# Patient Record
Sex: Female | Born: 1975 | Race: White | Hispanic: No | Marital: Married | Smoking: Former smoker
Health system: Southern US, Community
[De-identification: ages and names within clinical notes are randomized; demographics above are authoritative.]

## PROBLEM LIST (undated history)

## (undated) DIAGNOSIS — I10 Essential (primary) hypertension: Secondary | ICD-10-CM

## (undated) DIAGNOSIS — I509 Heart failure, unspecified: Secondary | ICD-10-CM

## (undated) DIAGNOSIS — T68XXXA Hypothermia, initial encounter: Secondary | ICD-10-CM

## (undated) DIAGNOSIS — I236 Thrombosis of atrium, auricular appendage, and ventricle as current complications following acute myocardial infarction: Secondary | ICD-10-CM

## (undated) DIAGNOSIS — N83209 Unspecified ovarian cyst, unspecified side: Secondary | ICD-10-CM

## (undated) HISTORY — DX: Heart failure, unspecified: I50.9

## (undated) HISTORY — PX: TONSILLECTOMY: SUR1361

## (undated) HISTORY — DX: Thrombosis of atrium, auricular appendage, and ventricle as current complications following acute myocardial infarction: I23.6

## (undated) HISTORY — DX: Hypothermia, initial encounter: T68.XXXA

## (undated) HISTORY — DX: Essential (primary) hypertension: I10

---

## 1998-05-28 ENCOUNTER — Other Ambulatory Visit: Admission: RE | Admit: 1998-05-28 | Discharge: 1998-05-28 | Payer: Self-pay | Admitting: Obstetrics & Gynecology

## 1999-05-06 ENCOUNTER — Other Ambulatory Visit: Admission: RE | Admit: 1999-05-06 | Discharge: 1999-05-06 | Payer: Self-pay | Admitting: Obstetrics & Gynecology

## 2000-05-16 ENCOUNTER — Other Ambulatory Visit: Admission: RE | Admit: 2000-05-16 | Discharge: 2000-05-16 | Payer: Self-pay | Admitting: Obstetrics & Gynecology

## 2001-07-10 ENCOUNTER — Other Ambulatory Visit: Admission: RE | Admit: 2001-07-10 | Discharge: 2001-07-10 | Payer: Self-pay | Admitting: Obstetrics & Gynecology

## 2002-08-09 ENCOUNTER — Other Ambulatory Visit: Admission: RE | Admit: 2002-08-09 | Discharge: 2002-08-09 | Payer: Self-pay | Admitting: Obstetrics & Gynecology

## 2003-08-13 ENCOUNTER — Other Ambulatory Visit: Admission: RE | Admit: 2003-08-13 | Discharge: 2003-08-13 | Payer: Self-pay | Admitting: Obstetrics & Gynecology

## 2003-08-14 ENCOUNTER — Other Ambulatory Visit: Admission: RE | Admit: 2003-08-14 | Discharge: 2003-08-14 | Payer: Self-pay | Admitting: Obstetrics & Gynecology

## 2003-12-09 ENCOUNTER — Other Ambulatory Visit: Admission: RE | Admit: 2003-12-09 | Discharge: 2003-12-09 | Payer: Self-pay | Admitting: Obstetrics & Gynecology

## 2004-08-27 ENCOUNTER — Other Ambulatory Visit: Admission: RE | Admit: 2004-08-27 | Discharge: 2004-08-27 | Payer: Self-pay | Admitting: Obstetrics & Gynecology

## 2005-09-08 ENCOUNTER — Other Ambulatory Visit: Admission: RE | Admit: 2005-09-08 | Discharge: 2005-09-08 | Payer: Self-pay | Admitting: Obstetrics & Gynecology

## 2007-07-09 ENCOUNTER — Inpatient Hospital Stay (HOSPITAL_COMMUNITY): Admission: AD | Admit: 2007-07-09 | Discharge: 2007-07-11 | Payer: Self-pay | Admitting: Obstetrics and Gynecology

## 2007-07-12 ENCOUNTER — Inpatient Hospital Stay (HOSPITAL_COMMUNITY): Admission: AD | Admit: 2007-07-12 | Discharge: 2007-07-12 | Payer: Self-pay | Admitting: Obstetrics and Gynecology

## 2007-07-13 ENCOUNTER — Encounter (INDEPENDENT_AMBULATORY_CARE_PROVIDER_SITE_OTHER): Payer: Self-pay | Admitting: Obstetrics & Gynecology

## 2007-07-13 ENCOUNTER — Inpatient Hospital Stay (HOSPITAL_COMMUNITY): Admission: AD | Admit: 2007-07-13 | Discharge: 2007-07-16 | Payer: Self-pay | Admitting: Obstetrics & Gynecology

## 2007-07-19 ENCOUNTER — Inpatient Hospital Stay (HOSPITAL_COMMUNITY): Admission: AD | Admit: 2007-07-19 | Discharge: 2007-07-19 | Payer: Self-pay | Admitting: Obstetrics and Gynecology

## 2007-10-12 ENCOUNTER — Ambulatory Visit: Payer: Self-pay

## 2007-10-12 ENCOUNTER — Ambulatory Visit: Payer: Self-pay | Admitting: Cardiology

## 2007-10-12 ENCOUNTER — Inpatient Hospital Stay (HOSPITAL_COMMUNITY): Admission: AD | Admit: 2007-10-12 | Discharge: 2007-10-17 | Payer: Self-pay | Admitting: Cardiology

## 2007-10-12 ENCOUNTER — Encounter: Payer: Self-pay | Admitting: Cardiology

## 2007-10-19 ENCOUNTER — Ambulatory Visit: Payer: Self-pay | Admitting: Cardiology

## 2007-10-19 ENCOUNTER — Ambulatory Visit: Payer: Self-pay

## 2007-10-19 LAB — CONVERTED CEMR LAB: Prothrombin Time: 13.6 s — ABNORMAL HIGH (ref 10.9–13.3)

## 2007-10-23 ENCOUNTER — Ambulatory Visit: Payer: Self-pay | Admitting: Cardiology

## 2007-10-23 LAB — CONVERTED CEMR LAB
INR: 1.3 — ABNORMAL HIGH (ref 0.8–1.0)
Prothrombin Time: 13.9 s — ABNORMAL HIGH (ref 10.9–13.3)

## 2007-10-25 ENCOUNTER — Ambulatory Visit: Payer: Self-pay | Admitting: Cardiology

## 2007-10-25 LAB — CONVERTED CEMR LAB
BUN: 5 mg/dL — ABNORMAL LOW (ref 6–23)
Chloride: 100 meq/L (ref 96–112)
Creatinine, Ser: 0.7 mg/dL (ref 0.4–1.2)
GFR calc non Af Amer: 102 mL/min
Glucose, Bld: 103 mg/dL — ABNORMAL HIGH (ref 70–99)
Sodium: 138 meq/L (ref 135–145)

## 2007-10-29 ENCOUNTER — Ambulatory Visit: Payer: Self-pay | Admitting: Cardiology

## 2007-11-07 ENCOUNTER — Ambulatory Visit: Payer: Self-pay | Admitting: Cardiovascular Disease

## 2007-11-12 ENCOUNTER — Ambulatory Visit: Payer: Self-pay | Admitting: Cardiology

## 2007-11-21 ENCOUNTER — Ambulatory Visit: Payer: Self-pay | Admitting: Internal Medicine

## 2007-11-21 ENCOUNTER — Ambulatory Visit (HOSPITAL_COMMUNITY): Admission: RE | Admit: 2007-11-21 | Discharge: 2007-11-21 | Payer: Self-pay | Admitting: Cardiology

## 2007-11-22 ENCOUNTER — Ambulatory Visit: Payer: Self-pay | Admitting: Internal Medicine

## 2007-12-10 ENCOUNTER — Ambulatory Visit: Payer: Self-pay | Admitting: Internal Medicine

## 2007-12-23 ENCOUNTER — Emergency Department (HOSPITAL_COMMUNITY): Admission: EM | Admit: 2007-12-23 | Discharge: 2007-12-23 | Payer: Self-pay | Admitting: Emergency Medicine

## 2007-12-25 ENCOUNTER — Encounter: Payer: Self-pay | Admitting: Cardiology

## 2007-12-25 ENCOUNTER — Ambulatory Visit: Payer: Self-pay | Admitting: Cardiology

## 2007-12-25 ENCOUNTER — Ambulatory Visit: Payer: Self-pay

## 2008-01-16 ENCOUNTER — Ambulatory Visit: Payer: Self-pay | Admitting: Cardiology

## 2008-01-21 ENCOUNTER — Ambulatory Visit: Payer: Self-pay | Admitting: Cardiology

## 2008-01-21 LAB — CONVERTED CEMR LAB
BUN: 10 mg/dL (ref 6–23)
CO2: 28 meq/L (ref 19–32)
GFR calc Af Amer: 106 mL/min
Potassium: 4.1 meq/L (ref 3.5–5.1)
Pro B Natriuretic peptide (BNP): 13 pg/mL (ref 0.0–100.0)
Sodium: 138 meq/L (ref 135–145)

## 2008-02-19 ENCOUNTER — Ambulatory Visit: Payer: Self-pay | Admitting: Cardiology

## 2008-02-28 ENCOUNTER — Ambulatory Visit: Payer: Self-pay | Admitting: Internal Medicine

## 2008-03-17 ENCOUNTER — Ambulatory Visit: Payer: Self-pay | Admitting: Cardiology

## 2008-04-09 ENCOUNTER — Ambulatory Visit: Payer: Self-pay | Admitting: Cardiology

## 2008-04-29 ENCOUNTER — Ambulatory Visit: Payer: Self-pay | Admitting: Internal Medicine

## 2008-04-29 ENCOUNTER — Ambulatory Visit: Payer: Self-pay | Admitting: Cardiovascular Disease

## 2008-04-29 ENCOUNTER — Encounter: Payer: Self-pay | Admitting: Internal Medicine

## 2008-04-29 ENCOUNTER — Ambulatory Visit: Payer: Self-pay

## 2008-12-12 ENCOUNTER — Encounter: Payer: Self-pay | Admitting: Internal Medicine

## 2008-12-12 ENCOUNTER — Ambulatory Visit: Payer: Self-pay

## 2008-12-12 ENCOUNTER — Ambulatory Visit: Payer: Self-pay | Admitting: Internal Medicine

## 2010-01-14 ENCOUNTER — Ambulatory Visit: Payer: Self-pay | Admitting: Internal Medicine

## 2010-01-14 DIAGNOSIS — I428 Other cardiomyopathies: Secondary | ICD-10-CM

## 2010-11-29 ENCOUNTER — Encounter: Payer: Self-pay | Admitting: Obstetrics & Gynecology

## 2010-12-07 NOTE — Assessment & Plan Note (Signed)
Summary: per check out/sf  Medications Added LISINOPRIL 10 MG TABS (LISINOPRIL) Take one-half tablet by mouth daily CARVEDILOL 6.25 MG TABS (CARVEDILOL) Take one tablet by mouth twice a day POTASSIUM CHLORIDE CRYS CR 20 MEQ CR-TABS (POTASSIUM CHLORIDE CRYS CR) Take one tablet by mouth daily      Allergies Added: NKDA  Visit Type:  Follow-up Primary Provider:  Devonne Doughty, MD  CC:  Follow-up.  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  Fortunately, her most recent echocardiogram in June 2009 and 2010 has shown a complete recovery of her LV function with an EF of 55-60%.   She returns for routine f/u. Doing well from caradiac standpoint. No CP or SOB. Occasionally tired. If stop lasix does get a little bit of swelling. Compliant with all meds. Not planning any further preganancies.    Current Medications (verified): 1)  Furosemide 20 Mg Tabs (Furosemide) .... Take One Tablet By Mouth Daily. 2)  Lisinopril 10 Mg Tabs (Lisinopril) .... Take One-Half Tablet By Mouth Daily 3)  Carvedilol 6.25 Mg Tabs (Carvedilol) .... Take One Tablet By Mouth Twice A Day 4)  Potassium Chloride Crys Cr 20 Meq Cr-Tabs (Potassium Chloride Crys Cr) .... Take One Tablet By Mouth Daily  Allergies (verified): No Known Drug Allergies  Past History:  Past Medical History: Last updated: 12/06/2008 1) CHF     --h/o peripartum CM EF 20%, onset 12/08     --LV function recovered EF 60% (6/09) 2) h/o LV apical thrombus, resolved 3) HTN  Review of Systems       As per HPI and past medical history; otherwise all systems negative.   Vital Signs:  Patient profile:   35 year old female Height:      68 inches Weight:      208 pounds BMI:     31.74 Pulse rate:   84 / minute BP sitting:   144 / 82  (left  arm)  Vitals Entered By: Laurance Flatten CMA (January 14, 2010 11:07 AM)  Physical Exam  General:  Gen: well appearing. no resp difficulty HEENT: normal Neck: supple. no JVD. Carotids 2+ bilat; no bruits Cor: PMI nondisplaced. Regular rate & rhythm. No rubs, gallops, murmur. Lungs: clear Abdomen: soft, nontender, nondistended. Good bowel sounds. Extremities: no cyanosis, clubbing, rash, edema. boot on RLE Neuro: alert & orientedx3, cranial nerves grossly intact. moves all 4 extremities w/o difficulty. affect pleasant healthy appearing.     Impression & Recommendations:  Problem # 1:  OTHER PRIMARY CARDIOMYOPATHIES (ICD-425.4) Dpoing very well. Would continue current medications. Can ues lasix as needed instead of once daily if she'd like. Reminded her of importanct of not getting pregnant to avoid recurrent cardiomyopathy.  Other Orders: EKG w/ Interpretation (93000)

## 2011-03-22 NOTE — Assessment & Plan Note (Signed)
[facility]                               LIPID CLINIC NOTE   NAME:Losh, Danaiya                           MRN:          [id number]  DATE:10/19/2007                            DOB:          [birth year]    Ms. Bobb had a PT INR drawn in the lab today because she has been  maintained on Lovenox 65 mg subcutaneously twice daily and was started  on Coumadin 5 mg p.o. daily earlier in the week, around the December  5th.  Her INR was called to me at 5:30 and was 1.2, virtually unchanged.  I contacted the patient by phone at about 6:15 this evening on her cell  phone.  I spoke with Ms. Erlandson and her husband.  I asked Ms. Inscoe to  continue her Lovenox 65 mg subcutaneously twice a day and increase her  Coumadin to 10 mg today only and then 7.5 mg daily.  She will come in at  8:30 on Tuesday morning, the 16th, for additional blood work at that  time.  That will be drawn in the lab and I will follow up with her as  soon as the results are available.   I confirmed with the patient she is having no unusual bleeding,  confirmed with the patient that her medications have not changed.  Per  her request, I will call the [facility] in [place] with her Lovenox  refill.  I did this tonight at 10 p.m.  The patient had a dose for  tonight.  I left a voice mail that she needed a box of 10 with one  refill.   Luis Abed, MD, [facility] is her physician.   And, the direction are to inject 0.65 ml which equal 65 mg  subcutaneously twice daily as per previous instructions.  The patient  and her husband verbalized understanding of this plan and plan for  followup.  She has the answering service number to call if questions or  problems in the meantime as does the pharmacy.  I gave the pharmacy my  pager number in addition to the answering service number with questions  or problems in the meantime.      Shelby Dubin, PharmD, BCPS, CPP  Electronically Signed      Bevelyn Buckles. Bensimhon, MD  Electronically Signed   MP/MedQ  DD: 10/19/2007  DT: 10/21/2007  Job #: [id number]

## 2011-03-22 NOTE — Assessment & Plan Note (Signed)
[facility]                            CARDIOLOGY OFFICE NOTE   NAME:[name], [name]                           MRN:          [id number]  DATE:04/29/2008                            DOB:          Jan 28, 1976    INTERVAL HISTORY:  [name] is a very pleasant 35 year old woman with a  history of severe peripartum cardiomyopathy with an EF of previously  20%.  This was diagnosed back in December 2008.  Most recent  echocardiogram showed normalization of her LV function with an EF of  55%.  This was in February 2009.  She initially had an apical clot, but  she has been on Coumadin and there was no evidence of this on her most  recent echo.   She comes today for routine followup.  She is doing great.  No chest  pain.  No shortness of breath.  She does have some occasional lower  extremity edema that she manages with Lasix.  She has not had any  limitations.   CURRENT MEDICATIONS:  1. Coreg 6.25 b.i.d.  2. Lasix 20 a day.  3. Warfarin.  4. Potassium 20 a day.  5. Digoxin 0.125 a day.  6. Lisinopril 10 mg a day.   PHYSICAL EXAMINATION:  GENERAL:  She is well appearing in no acute  distress.  Ambulatory in the clinic without any respiratory difficulty.  VITAL SIGNS:  Blood pressure is 100/60, heart rate is 72, and weight is  172.  HEENT:  Normal.  NECK:  Supple.  No JVD.  Carotids are 2+ bilaterally without any bruits.  There is no lymphadenopathy or thyromegaly.  CARDIAC:  Regular rate and rhythm.  No murmurs, rubs, or gallops.  PMI  is nondisplaced.  LUNGS:  Clear.  ABDOMEN:  Soft, nontender, and nondistended.  No hepatosplenomegaly.  No  bruits.  No masses appreciated.  EXTREMITIES:  Warm with no cyanosis, clubbing, or edema.  No rash.  NEURO:  Alert and oriented x3.  Cranial nerves II-XII are intact.  Moves  all 4 without difficulty.  Affect is pleasant.   EKG shows sinus rhythm at a rate of 61.  No ST-T wave abnormalities.   ASSESSMENT/PLAN:  1.  History of congestive heart failure secondary to peripartum      cardiomyopathy.  Her ejection fraction has now normalized.  We will      get another echocardiogram to confirm that this is still the case.      I suspect it is so.  We will stop her digoxin today and likely stop      her Coumadin after her echo.  I would keep her on her ACE inhibitor      and beta-blocker, probably lifelong.  We once again had a      discussion about the fact that she should take measures so that she      does not become pregnant again due to the risk of recurrence and      possibly even leading to death or need for transplant.  She      understands this  and agrees.   DISPOSITION:  We will await the results of her echocardiogram.  If it  remains normal, once again, she can stop her Coumadin, and she will  follow up with [place] in 1 year.     [name] Buckles. Bensimhon, MD  Electronically Signed    DRB/MedQ  DD: 04/29/2008  DT: 04/30/2008  Job #: [id number]

## 2011-03-22 NOTE — Assessment & Plan Note (Signed)
[facility]                            CARDIOLOGY OFFICE NOTE   NAME:Lamere, Negar                           MRN:          [id number]  DATE:12/12/2008                            DOB:          Mar 13, 1976    INTERVAL HISTORY:  [name] is a very pleasant 35 year old woman with a  history of congestive heart failure secondary to severe peripartum  cardiomyopathy with ejection fraction of 20%, dating back to December  2008 after the birth of her twins.  Fortunately, her most recent  echocardiogram in June 2009 has shown a complete recovery of her LV  function with an EF of 55-60%.  Her initial course was complicated by an  apical clot, which she was treated with Coumadin.  However, this has  resolved and she has been off her Coumadin.   She comes in today for 39-month followup.  Overall, she is doing fairly  well.  She notes that she remains very fatigued, but says this is mostly  due to the fact that she is under extreme amount of stress, both at home  and with work.  Her husband lost his job due to his temper and she is  the only one supporting the 5 members of her family.  Her father also  underwent carotid endarterectomy and bypass surgery and scheduled to  have his other carotid operated on today.   She denies any orthopnea or PND.  She says she is able to do all she  needs to do without any exertional dyspnea.  She does continue to have a  little bit of lower extremity edema throughout the day, which resolves  overnight.  She has not had any palpitations, syncope, or presyncope.  She has been taking Xanax occasionally for her stress level.  She is now  using a Mirena IUD and she is well aware of the fact that she needs to  prevent any further pregnancies.   CURRENT MEDICATIONS:  1. Coreg 6.25 b.i.d.  2. Lasix 20 a day.  3. Potassium 20 a day.  4. Lisinopril 10 mg a day.  5. A bunch of vitamins and minerals.   PHYSICAL EXAMINATION:  GENERAL:  She is a  well-appearing in no acute  distress.  She ambulates around the clinic without any respiratory  difficulty.  VITAL SIGNS:  Blood pressure is 120/80 and heart rate is 79.  Her weight  is 198 pounds, which is up about 50 pounds from April of last year.  HEENT:  Normal.  NECK:  Supple.  There is no JVD.  Carotids are 2+ bilaterally without  any bruits.  There is no lymphadenopathy or thyromegaly.  CARDIAC:  Her  PMI is normal.  She has regular rate and rhythm with no murmurs, rubs,  or gallops.  LUNGS:  Clear.  ABDOMEN:  Obese and nontender.  There is no hepatosplenomegaly  appreciated.  No bruits or masses.  There are hypoactive bowel sounds.  EXTREMITIES:  Warm with no cyanosis or clubbing.  There is trace edema.  No rash.  NEUROLOGIC:  Alert and oriented x3.  Cranial nerves II through XII are  grossly intact.  She moves all 4 extremities without difficulty.  Affect  is pleasant, but somewhat stressed.   EKG shows sinus arrhythmia at a rate of 79.  No ST-T wave abnormalities.   ASSESSMENT/PLAN:  History of congestive heart failure, secondary to  peripartum cardiomyopathy.  This is resolved.  Given her fatigue and  edema, I do think it is reasonable to repeat one more echocardiogram to  make sure that her left ventricular function remains normal.  Though  based on her exam, I would be very surprised if this were not the case.  We once again revisited the issue of her using a significant birth  control methods, so that she does not get pregnant again for fear of  recurrent peripartum myopathy.   DISPOSITION:  We will see her back in 1 year for followup.  Pending the  results of her echocardiogram.     Bevelyn Buckles. Bensimhon, MD  Electronically Signed    DRB/MedQ  DD: 12/12/2008  DT: 12/12/2008  Job #: [id number]

## 2011-03-22 NOTE — Assessment & Plan Note (Signed)
[facility]                          CHRONIC HEART FAILURE NOTE   NAME:[name], [name]                           MRN:          [id number]  DATE:10/25/2007                            DOB:          [birth year]    PRIMARY CARDIOLOGIST:  Dr. Jerral Bonito.   No primary care physician at this time.   [name] is new to the heart failure clinic.  She is a very pleasant  35 year old Caucasian female, status post recent delivery of healthy  twins.  Post partum developed symptoms of increased shortness of breath,  fatigue, and volume overload.  Was admitted to [facility] on  October 12, 2007 for acute systolic congestive heart failure secondary  to post partum cardiomyopathy.  She was found to have an LV thrombus  requiring anticoagulation by echocardiogram at that time.  She was also  diagnosed with hypertension, mitral regurgitation, sinus tachycardia,  pericardial effusion.  [name] was followed closely throughout  hospitalization, treated with IV heparin.  Coumadin was initiated per  pharmacy.  She was diuresed.  Beta blocker and digoxin initiated for  heart failure and sinus tachycardia.  TSH was noted to be within normal  limits.  On December the 10th, the patient was felt to be stable enough  to be discharged home.  The patient returns today for followup and  further management of her heart failure and medication titration.  Ms.  [name] is a very pleasant lady.  She is accompanied by her husband,  [name].  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  She has a  44 year old child at home.  She has a very supportive family.  Members  are assisting with care of her twins.  She is rather emotional today  considering all that she has been through, and very attentive to any  information she can gather in regards to her diagnosis.  She is  apprehensive about her future, and whether or not she can return to work  and lead a normal life in caring  for her children.  She does have a  family history of coronary artery disease with her father having an MI  in his late 75s, and having stent placement.  She states during her  first pregnancy 10 years ago, she was toxemic, and was placed on  hydrochlorothiazide post partum for control of fluid and hypertension,  but states that was only for short term, and has not had any problems  until this occurred.   PAST MEDICAL HISTORY:  1. New-onset congestive heart failure, most likely secondary to      nonischemic cardiomyopathy in the setting of peripartum      cardiomyopathy with an ejection fraction of 20% by echocardiogram.  2. Status post echocardiogram during recent hospitalization showing RV      dysfunction, moderate pericardial effusion, probable pleural      effusion and large, mobile clot in LV.  3. Anticoagulation therapy.  4. Hypertension.  5. Sinus tachycardia.   CURRENT MEDICATIONS:  1. Coreg 3.125 mg b.i.d.  2. Furosemide 40 mg daily.  3. Lisinopril 20 mg daily.  4. Digoxin 0.25 mg daily.  5. KCl 20 mEq daily.  6. Coumadin per Coumadin clinic.   Medications p.r.n., the patient states she is taking none at this time.   PHYSICAL EXAMINATION:  Weight 143 pounds, blood pressure 90/58 with a  heart rate of 84, resting pulse ox 98% on room air.  [name] is in no acute distress.  She has no signs of jugular vein distention at 45 degree angle today.  LUNGS:  Clear to auscultation.  CARDIOVASCULAR EXAM:  Reveals an S1 and S2.  I do not hear an S3 gallop  at this time.  ABDOMEN:  Soft, nontender, positive bowel sounds.  LOWER EXTREMITIES:  Without cyanosis, clubbing, or edema.  NEUROLOGICALLY:  Alert and oriented x3.   CLINICAL DATA:  Ambulated patient greater than 300 feet here in the  clinic.  She became mildly dyspneic at the end of the walk.  Resting  pulse ox 98%, drifted down to 94% during the walk without further  problems, returned to baseline quickly.  No  lightheadedness or  dizziness.   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  The patient without signs of volume overload at this  time.  I have spent approximately 1 hour with [name] and [name], reviewing  the etiology of her heart failure, medications, management and a brief  expectation of the next few months.  Adjustments will be made in  medications today.  [name] has not tolerated further titration of  medications during recent hospitalization.  We are going to stagger her  meds.  This is her medication regimen as of now.  In the morning she  will take Coreg 3.125 mg at that time, Lasix 10 mg, and Digoxin 0.25.  At noon she will take lisinopril 10 mg.  At 3 p.m. she will take Lasix  10 mg, and her Coumadin, and then at bedtime she will take Coreg 2 of  her 3.125 mg tablets to equal 6.25 mg.  I will see her back in 2 weeks.  Will also check baseline lab work at this time.      Dorian Pod, ACNP  Electronically Signed      Rollene Rotunda, MD, [facility]  Electronically Signed   MB/MedQ  DD: 10/25/2007  DT: 10/26/2007  Job #: [id number]

## 2011-03-22 NOTE — Assessment & Plan Note (Signed)
[facility]                          CHRONIC HEART FAILURE NOTE   NAME:[name], [name]                           MRN:          [id number]  DATE:12/10/2007                            DOB:          [birth year]    PRIMARY CARDIOLOGIST:  Dr. Gala Romney.  No primary care physician at this  time.   Niaja returns today for follow-up of her congestive heart failure which is  most likely secondary to nonischemic cardiomyopathy in the setting of  peripartum cardiomyopathy with EF 20% by echocardiogram.  I did have [name]  complete CPX test recently which she did quite well on.  Her peak pO2  was 27.5 peak RER 1.21.  Exercise testing with gas exchange demonstrate  low normal functional capacity and compared to match sedentary norms.  There appeared to be of mild ventilatory limit at peak exercise but this  may indicate the patient motivation to do well given her overall normal  spirometry.  [name] states she is feeling back to her normal self.  She  does complain of mild fatigue and some chest discomfort.  When  questioned about the chest discomfort it is an atypical chest discomfort  brought on by movement change in position.  She does complain of some  mild fatigue and occasional lightheadedness and dizziness if she moves  too abruptly.  Otherwise she continues to care for her twins and her 37-  year-old son.  She is anxious to return to work.  Last echocardiogram in  December showed EF of 20%.   PAST MEDICAL HISTORY:  1. New-onset congestive heart failure in the setting of peripartum      cardiomyopathy with EF 20% by echocardiogram.  2. Echocardiogram showing RV dysfunction, moderate pericardial      effusion, probable pleural effusion and large mobile clot in LV  3. Anticoagulation therapy secondary to above.  4. History of hypertension.  5. Sinus tachycardia.   REVIEW OF SYSTEMS:  As stated above, otherwise negative.   PHYSICAL EXAM:  Weight 151 up 4 pounds, blood  pressure 104/69, heart  rate 58.  CLINICAL DATA:  12-lead EKG today showed sinus brady at a rate 51 no  acute findings.  In no acute distress.  No signs of jugular vein distention of 45 degrees angle.  LUNGS:  Clear to auscultation bilaterally.  Cardiovascular exam reveals S1-S2 regular rhythm.  ABDOMEN:  Soft, nontender, positive bowel sounds.  LOWER EXTREMITIES:  Without clubbing, cyanosis or edema.  Neurologically alert and oriented x3.   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  Status post recent CPX test with good results.  We will  plan on repeating echocardiogram. I have already cleared [name] to return  to work as tolerated.  Will continue her Coreg at current dose and the  cut  her digoxin back to 0.125 mg daily secondary to sinus brady.  Will touch  base with [name] by phone after echocardiogram results obtained.      Dorian Pod, ACNP  Electronically Signed      Rollene Rotunda, MD, [facility]  Electronically Signed  MB/MedQ  DD: 12/10/2007  DT: 12/10/2007  Job #: [id number]

## 2011-03-22 NOTE — Assessment & Plan Note (Signed)
[facility]                          CHRONIC HEART FAILURE NOTE   NAME:[name], [name]                           MRN:          [id number]  DATE:01/16/2008                            DOB:          May 21, 1976    PRIMARY CARDIOLOGIST:  Dr. Gala Romney.   PRIMARY CARE PHYSICIAN:  None at this time.   [name] returns today for followup of congestive heart failure which is  secondary to peripartum nonischemic cardiomyopathy with EF of 20% by  echocardiogram.  Most recent echocardiogram repeated December 25, 2007  showed EF back to 55%.  No mural apical clot noted at that time.  [name]  also underwent a CPX test recently that showed normal functional  capacity compared to matched sedentary norms and has done amazingly well  since her diagnosis back in December.  She continues to remain  anticoagulated, followed here in the Coumadin Clinic by Dr. Shelby Dubin.  She has returned to work full time and is caring for her 61-month-old  twins with the assistance of family and her husband.  She had an episode  of chest discomfort and was seen in the [facility] emergency room where  she was found to have negative cardiac enzymes.  Chest pain was felt to  be noncardiac in origin.  [name] states she returned home and had no  further problems.  Since returning to work, however, she has complained  of some abdominal bloating and some ankle edema by the end of the day.  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  She has been taking Lasix  10 mg b.i.d. but states this does not seem to be helping with the  bloating.  She denies any orthopnea or PND.  Has mild occasional  dizziness if she stands too quickly or turns too quickly.   PAST MEDICAL HISTORY:  1. Congestive heart failure in the setting of presumed nonischemic      cardiomyopathy with EF previously 20% now 55%.  2. Previous echocardiogram showing RV  dysfunction, moderate      pericardial effusion, probable pleural effusion and large mobile      clot.  Repeat echocardiogram just the last month showed no mural      apical clot, mild mitral valve regurgitation with EF 55%, and left      atrium was mildly dilated.  3. Anticoagulation therapy secondary to above.  4. History of hypertension.  5. Sinus tachycardia.   REVIEW OF SYSTEMS:  As stated above, otherwise negative.   CURRENT MEDICATIONS:  1. Include Coreg 6.25 mg b.i.d.  2. Furosemide 10 mg b.i.d.  3. Warfarin as directed.  4. KCl 20 mEq [name].  5. Digoxin 0.125 mg [name].  6. Lasix 10 mg p.r.n.   CLINICAL DATA:  Most recent lab work done February 15, hemoglobin 11.5,  hematocrit 33.4.  PT INR 25.9 and 2.3.  Potassium 4.4, BUN and  creatinine 9 and 0.7, AST 27, ALT 12.  Point of care, negative.  BNP  less than  30, dig 0.6.   PHYSICAL EXAM:  Weight 158, blood pressure 117/65 with a heart rate of  81.  [name] is in no acute distress.  Well-nourished 35 year old at Caucasian  female.  JVD maybe 6 to 7 cm at 45 degree angle.  LUNGS:  Clear to auscultation.  Cardiovascular exam reveals S1-S2 regular rate and rhythm.  ABDOMEN:  Soft, nontender, positive bowel sounds.  LOWER EXTREMITIES:  Without clubbing, cyanosis.  She has trace ankle  edema.  NEUROLOGICAL:  Alert and oriented x3.   IMPRESSION:  Congestive heart failure secondary to presumed nonischemic  peripartum cardiomyopathy.  Echocardiogram with significant improvement.  Will continue medications at current dose and the patient needs to  follow up with Dr. Gala Romney for routine cardiology visit.  I suspect  that the patient may need a stress test just to confirm nonischemic  etiology after recent episode of chest discomfort.  Will leave this up  to Dr. Prescott Gum discretion.  I have cautioned [name] about her fluid  intake, to use sips of water during the day, hard candy, or ice, as she  is on the phone a lot talking and  she states her mouth gets dry.  Will  check blood work today also.      Dorian Pod, ACNP  Electronically Signed      Rollene Rotunda, MD, [facility]  Electronically Signed   MB/MedQ  DD: 01/16/2008  DT: 01/17/2008  Job #: [id number]

## 2011-03-22 NOTE — Assessment & Plan Note (Signed)
[facility]                            CARDIOLOGY OFFICE NOTE   NAME:[name], [name]                           MRN:          [id number]  DATE:10/12/2007                            DOB:          18-Dec-1975    HISTORY OF PRESENT ILLNESS:  [name] is 35 years old.  She recently  delivered healthy twins.  Prior to her delivery she was doing well.  She  has had some borderline hypertension and fluid overload.  Since then,  however, she has had significant shortness of breath.  This has been  quite limiting to her.  She has not had any true PND or orthopnea.  However, she has had shortness of breath both during the day and at  night and restless activity level.  She had not had syncope or  presyncope.  There is a family history of coronary disease with her  father having MI in approximately his late 21s.  She is not having any  significant chest pain.   PAST MEDICAL HISTORY:   ALLERGIES:  No known drug allergies.   MEDICATIONS:  She is not taking any medications on a regular basis at  this time.   SOCIAL HISTORY:  The patient is married.  She has her twins and a 4-  year-old son.  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  Her husband also does the same.  She does not  smoke.   FAMILY HISTORY:  There is a family history of coronary disease as  outlined above.   REVIEW OF SYSTEMS:  The patient has history of some headaches.  There is  a question of some hypertension in the past  Otherwise, her review of  systems is negative.   PHYSICAL EXAMINATION:  VITAL SIGNS:  Weight today is 160 pounds.  Blood  pressure 155/109, pulse 111.  GENERAL:  The patient is oriented to person, place and time.  Affect is  normal.  HEENT:  No xanthelasma.  She has normal extraocular motion.  There are  no carotid bruits.  There is no jugular venous distention.  LUNGS:  Clear.  Respiratory effort is not labored.  CARDIAC:  S1, S2.   There appears to be a summation gallop at this time.  Her heart rate is elevated.  ABDOMEN:  Soft.  There are no masses or bruits.  The patient does have  1+ peripheral edema.  There are no musculoskeletal deformities.   STUDIES:  EKG reveals sinus tachycardia with nonspecific ST-T wave  changes.  The patient tells me she has had her thyroid checked and this  was normal.   PROBLEMS:  1. Hypertension.  She is hypertensive at this time and we will start      medications for this.  2. History of headaches.  3. Question of a systolic murmur.  With her rapid rate today, I do not      hear a definite murmur, but we will reassess her.  The echo will      also tell us more.  4. EKG  with nonspecific ST-T wave changes.  5. Peripheral edema.  I do believe that she is fluid overloaded and      this needs to be treated .  6. Resting sinus tachycardia.  This also needs to be treated.  7. Question of a summation gallop on her physical exam.  8. Shortness of breath since her delivery.   I am concerned about the patient's symptomatology.  She needs a 2D echo  soon to rule out a pericardial cardiomyopathy.  She needs to be on low-  dose Coreg.  She also needs to be on a diuretic.  She is breast feeding.  She wants to do of course what is best for her overall health at this  point.  We will list her medications, and she will be in touch with Dr.  Edward Jolly concerning whether she can continue to breast feed.  We will  obtain a 2D echo and see her in very early follow up next week to  reassess her status.     Luis Abed, MD, [facility]  Electronically Signed    JDK/MedQ  DD: 10/12/2007  DT: 10/12/2007  Job #: [id number]   cc:   Randye Lobo, M.D.

## 2011-03-22 NOTE — Assessment & Plan Note (Signed)
[facility]                          CHRONIC HEART FAILURE NOTE   NAME:[name], [name]                           MRN:          [id number]  DATE:11/12/2007                            DOB:          22-Jul-1976    PRIMARY CARDIOLOGIST:  Dr. Jerral Bonito.   No primary care physician at this time.   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  I initially saw [name] as a new patient on  December 18.  She returns today for reevaluation and further titration  of her medications.  When I saw [name] in December I wrote out exactly how  I wanted her to take her medications, as I was titrating her carvedilol.  She states tolerance of medication adjustments.  She has complained of  mild dizziness if she moves too quickly, otherwise has done quite well  on current doses.  She continues to complain of ongoing fatigue but  denies any shortness of breath or dyspnea on exertion.  She is  complaining of some frequent headaches but has been under increased  stress as she has not been working and has relatively new twins at home  to care for.  She does have the assistance and support of her husband  and family, but states she is anxious to find out whether or not she is  going to be able to return to work.  I currently have [name] taking 3.125  mg of carvedilol in the a.m. and 6.25 in the evening.  She does complain  of some mild weight gain over the holidays (Christmas and New Year's)  with some mild abdominal bloating but no peripheral edema.  Denies any  presyncope or syncopal episodes.   PAST MEDICAL HISTORY:  1. 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  2. Echocardiogram showing RV dysfunction, moderate pericardial  effusion, probable pleural effusion and large mobile clot in LV.  3. Anticoagulation therapy secondary to the above.  4. History of hypertension.  5. Sinus tachycardia.   REVIEW OF SYSTEMS:  As stated above.   CURRENT MEDICATIONS:  1. Coreg 3.125 mg in the morning and 6.25 mg in the evening.  2. Lasix 10 mg in the a.m. and p.m.  3. Digoxin 0.25 mg in the a.m.  4. Lisinopril 10 mg at noon.  5. Warfarin per [facility] at 4 p.m.   PHYSICAL EXAMINATION:  Weight 146, blood pressure 126/68, manual 118/76,  with a heart rate of 70.  [name] is in no acute distress.  No signs of  jugular vein distention at 45-degrees angle.  LUNGS:  Clear to auscultation.  CARDIOVASCULAR EXAM:  Reveals S1 and S2, regular rate and rhythm.  I do  not hear an S3 gallop at this time.  ABDOMEN:  Soft, nontender, mildly distended.  LOWER EXTREMITIES:  Without clubbing, cyanosis or edema.  NEUROLOGICALLY:  Alert and oriented x3.   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  At this time, the patient continues to have class II  symptoms.  I am going to increase her Coreg to 6.25 mg b.i.d., schedule  her for a CPX test for further evaluation, verification of her  limitations, and follow up in 4 weeks for reevaluation.  [name] knows to  call me on my cell phone if she has any problems.  I am also going to  have her take an extra 20 mg of Lasix today.      [name], ACNP  Electronically Signed      Rollene Rotunda, MD, [facility]  Electronically Signed   MB/MedQ  DD: 11/12/2007  DT: 11/12/2007  Job #: [id number]

## 2011-03-22 NOTE — H&P (Signed)
NAMEEVERLENE, CUNNING                  ACCOUNT NO.:  [id number]   MEDICAL RECORD NO.:  [id number]           PATIENT TYPE:   LOCATION:                                 FACILITY:   PHYSICIAN:  Luis Abed, MD, FACCDATE OF BIRTH:  11/03/1976   DATE OF ADMISSION:  DATE OF DISCHARGE:                              HISTORY & PHYSICAL   [name] is a very pleasant 35 year old.  I saw her in the office  today and decision has been made to admit her to the hospital.  She  recently delivered healthy twins.  She had borderline hypertension and  fluid overload.  Since then, she has had marked shortness of breath.  This has been quite limiting for her.  She has had some mild PND.  She  has had some peripheral edema.  She has not been able to move about well  other than feeling quite short of breath.  She does have a family  history of coronary disease through her father having a heart attack in  her late 22s.  She has not had any significant chest pain.   ALLERGIES:  No known drug allergies.   MEDICATIONS:  She is not on any medications on a regular basis at this  time.   SOCIAL HISTORY:  The patient is married.  She has her twins and a 13-  year-old son.  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  Her husband does the same.  She does not smoke.   FAMILY HISTORY:  There is a family history of coronary disease.   REVIEW OF SYSTEMS:  She has had some mild headaches.  Otherwise, her  review of systems is negative.   PHYSICAL EXAMINATION:  VITAL SIGNS:  Her weight when I saw her today was  160 pounds.  Blood pressure was 155/109 with a pulse of 111.  GENERAL APPEARANCE:  Patient was oriented to person, time and place.  Her affect was normal.  Her husband was in the room at the time of  evaluation.  HEENT:  There was no xanthelasma.  She has normal extraocular motion.  NECK:  There were no carotid bruits.  She had no jugular venous  distension.  LUNGS:   Evaluation did not reveal any rales to my exam.  Her respiratory  effort was not labored.  CARDIOVASCULAR:  Exam revealed a sinus tachycardia.  There was an S1  with an S2 and a summation gallop.  ABDOMEN:  Soft, no masses or bruits.  EXTREMITIES:  She did have 1+ peripheral edema.  There were no major  musculoskeletal deformities.   EKG revealed sinus tachycardia with nonspecific ST-T wave changes.  She  tells me that her thyroid was checked recently and that this was normal.  After the visit was completed, the patient had a 2-dimensional echo  which I reviewed and immediately followed by contacting the patient and  arranging for admission.  Her echo shows that the patient's ventricle is  of abnormal size.  Her ejection fraction is 20%.  There is some  mitral  regurgitation.  She has a moderate pericardial effusion that is  circumferential.  There also appears to be a pleural effusion.  There is  right ventricular dysfunction in addition.  Also, there is a large  mobile clot in the apex of the left ventricle.   PROBLEMS:  1. Hypertension.  She is hypertensive at this time.  This will be      treated.  2. History of headaches.  3. Mitral regurgitation by echo.  4. Sinus tachycardia.  5. Peripheral edema.  6. Ejection fraction of 20%.  7. Right ventricular dysfunction.  8. Moderate pericardial effusion that is circumferential with no      evidence of tamponade.  9. Moderate pulmonary hypertension with a right ventricular systolic      pressure, estimate of 50 mmHg.  10.Probable pleural effusions.  She needs a chest x-ray.  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   The patient is admitted.  We will start Lasix.  Captopril will be  started at 6.25 p.o. t.i.d.  Heparin will be started immediately along  with Coumadin.  We can decide on October 13, 2007, if she is stable  enough for the initiation of a beta-blocker.   I called and explained the entire situation to the patient.   She was  tearful over the telephone but willing and understanding to come into  the hospital today.  Arrangements have been made.  Dr. Gala Romney has  also seen the patient's echo and I have reviewed the entire status with  him.  He will be seeing her over the weekend.   The patient has a significant cardiovascular problem at this point and  she will be treated aggressively.  Will also notify her gynecologist.  The patient has already notified her pediatricians who has instructed  her to stop breast-feeding altogether.  Therefore, we will not have to  worry about any medications in this regard.      Luis Abed, MD, [facility]  Electronically Signed     JDK/MEDQ  D:  10/12/2007  T:  10/12/2007  Job:  [id number]

## 2011-03-22 NOTE — Op Note (Signed)
[name], [name]                  ACCOUNT NO.:  [id number]   MEDICAL RECORD NO.:  [id number]          PATIENT TYPE:  INP   LOCATION:  9198                          FACILITY:  [facility]   PHYSICIAN:  Gerrit Friends. Aldona Bar, M.D.   DATE OF BIRTH:  1976-05-12   DATE OF PROCEDURE:  07/13/2007  DATE OF DISCHARGE:                               OPERATIVE REPORT   PREOPERATIVE DIAGNOSES:  1. A 33 to 34-week intrauterine pregnancy.  2. Twins.  3. Preterm labor.   POSTOPERATIVE DIAGNOSES:  1. A 33 to 34-week intrauterine pregnancy.  2. Twins.  3. Preterm labor.  4. Delivery of twin A - vacuum extraction assisted - Vertex - 2275      grams - Apgars 9 and 9; twin B - frank breech - assisted breech      extraction - 2106 grams - Apgars 8 and 9.  Both A & B Female.   PROCEDURE:  Primary low transverse cesarean section.   SURGEON:  Gerrit Friends. Aldona Bar, M.D.   ANESTHESIA:  Spinal, Dr. Arby Barrette.   HISTORY:  This 35 year old gravida 4, para 1 with known twins has been  followed very closely and for the past five days has had multiple visits  to [facility] with preterm contractions and borderline  hypertension.  She was actually admitted for a 48-hour duration and  subsequently discharged on Procardia, but continued to have contractions  and remained very uncomfortable although her blood pressures essentially  normalized with all lab data being normal.  She came to the office this  morning for follow-up, having been seen yesterday maternal/fetal  medicine with reactive nonstress test of both twins.  In the office, she  was contracting probably every 6-7 minutes, was extremely uncomfortable,  was noted to have gained 12 pounds in the past week and although she had  no proteinuria and her blood pressure was acceptable, on vaginal exam it  was found that her cervix had changed to at least 4 cm of dilatation and  she was sent to [facility] to be readied for delivery by primary  low transverse cesarean  section because of progressive preterm labor.   The patient was taken to the operating room and once in the operating  room, spinal anesthetic was placed by Dr. Arby Barrette without difficulty.  Thereafter, the patient was prepped and draped in the usual fashion for  cesarean section with a Foley catheter placed.  Both babies had good  fetal heart tones prior to the onset of the procedure.   Once the patient was adequately prepped and draped and good anesthetic  levels were documented, procedure was begun.  Pfannenstiel incision was  made with minimal difficulty and dissected down sharply to and through  the fascia which was incised in a low transverse fashion as well.  Good  hemostasis was created in each layer.  Subfascial space was created  inferiorly and superiorly, muscles separated in the midline.  Peritoneum  identified and entered appropriately with care taken to avoid bowel  superiorly and the bladder inferiorly.  At this time, vesicouterine  peritoneum  was then identified, incised in a low transverse fashion,  pushed off lower uterine segment with ease and then sharp incision into  the lower segment uterus Metzenbaum scissors was carried out in a low  transverse fashion and extended laterally with the fingers.  Amniotomy  in twin A was noted to be clear and with the aid of the vacuum  extractor, twin A was delivered from a vertex position and after the  cord was clamped and cut, the infant was passed off to the awaiting team  and ultimately taken to the intensive care nursery in good condition.  Subsequent weight was found to be 2275 grams and Apgars were assigned at  9 and 9.   At this time, baby B was found to be in a breech position and after  amniotomy was carried out, an assisted breech extraction was carried out  without difficulty.  Apgars were noted to be 8 and 9, weight found to be  2106, after presentation to the nursery team and ultimately the baby was  likewise taken  to the newborn intensive care unit in good condition.  At  this time, cord bloods were collected from A and B and labeled  appropriately and thereafter, the placenta was delivered intact and sent  pathology labeled appropriately.   At this time, the uterus was exteriorized, rendered free of any  remaining products of conception and good uterine contractility was  afforded with slowly given intravenous Pitocin and manual stimulation.  There was a small anterior myoma on the anterior surface of the uterus  of minimal consequence.  Tubes and ovaries appeared normal.   At this time with good uterine contractility afforded with slowly given  intravenous Pitocin and manual stimulation, the uterine incision was  closed with single layer of #1 Vicryl in a running locking fashion and  this was oversewn with several figure-of-eight #1 Vicryls with excellent  hemostasis produced.   The uterus this time was replaced into the abdominal incision after  lavaged of all free blood and clot.  At this time, all counts were noted  to be correct and no foreign bodies were noted to be remaining in the  abdominal cavity.   At this time, closure of the abdomen was begun in layers.  The abdominal  peritoneum was closed with 0 Vicryl in a running fashion.  Muscle  secured with same.  Assured of good fascial hemostasis, the fascia then  reapproximated with 0 Vicryl from angle to midline bilaterally.  Subcutaneous tissues were noted to be hemostatic and staples were used  to close the skin.  Sterile pressure dressing was applied and the  patient, at this time, was transported to recovery in satisfactory  condition, having tolerated the procedure well.   ESTIMATED BLOOD LOSS:  700 mL.   All counts correct x2.   In summary, this patient presented at almost [redacted] weeks gestation and in  preterm labor with known twins and was taken to the operating room for  primary low transverse cesarean section with delivery of  twin A from a  vertex position and twin B from a breech position.  At the conclusion of  the procedure, both mother and babies were doing well in their  respective recovery areas.      Gerrit Friends. Aldona Bar, M.D.  Electronically Signed     RMW/MEDQ  D:  07/13/2007  T:  07/13/2007  Job:  [id number]

## 2011-03-22 NOTE — Assessment & Plan Note (Signed)
[facility]                          CHRONIC HEART FAILURE NOTE   NAME:[name], [name]                           MRN:          [id number]  DATE:02/28/2008                            DOB:          04/18/1976    PRIMARY CARDIOLOGIST:  Bevelyn Buckles. Bensimhon, MD.   PRIMARY CARE PHYSICIAN:  None at this time.   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  Most recent echocardiogram in February of  this year shows an EF of 55%.  No mural apical clot noted at that time  as previously diagnosed.  Kailen also underwent a CPX test recently that  showed normal functional capacity.  Marguetta has continued to do amazingly  well since her diagnosis back in December of 2008.  She continues to  remain anticoagulated, followed here in the [facility].  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  She denies any symptoms suggestive of volume  overload.  She has gained weight, but states she has not been as active  as previously and thinks this is due to eating more.  She denies any  symptoms of presyncope, syncope, lightheadedness or dizziness.  She  states she feels like she is much stronger now and is returning to her  baseline self and is very pleased with her progress.  She is questioning  today whether or not she could possibly come off any of her medications  at some point in the future.  I have referred Janine to Dr. Gala Romney for  further possibility of medication discontinuation.   PAST MEDICAL HISTORY:  1. Congestive heart failure in the setting of previous presumed      nonischemic cardiomyopathy with EF previously 20%, now 55%.  2. Previous echocardiogram showing RV dysfunction, moderate      pericardial effusion,  probable pleural effusion and a large mobile      clot.  Repeat echocardiogram in February of this year showed no      mural apical clot, mild mitral valve regurgitation with EF 55%, and      the left atrium was mildly dilated.  3. Anticoagulation therapy secondary to #2.  4. History of hypertension.  5. Sinus tachycardia.   REVIEW OF SYSTEMS:  As stated above, otherwise negative.  Current  medications include Coreg 6.25 mg b.i.d., furosemide 20 mg daily,  warfarin per [facility], KCl 20 mEq daily, digoxin 0.125 mg daily,  and lisinopril.  The patient is unsure if she is taking 20 mg of  lisinopril or 10 mg.  She is to call me when she gets home and confirm  this.  She should be taking 20 mg daily.   PHYSICAL EXAM:  Weight 163, weight is up 7 pounds from 37/66 with a heart rate of 82.  Iram is in no acute  distress.  No signs of jugular vein distention at 45-degree angle.  LUNGS:  Clear to auscultation bilaterally.  CARDIOVASCULAR EXAM:  Reveals an S1 and S2.  Regular rate and rhythm.  ABDOMEN:  Soft, nontender, positive bowel sounds.  LOWER EXTREMITIES:  Without clubbing, cyanosis or edema.  NEUROLOGICAL:  Alert and oriented x3   IMPRESSION:  Congestive heart failure secondary to presumed nonischemic  peripartum cardiomyopathy, EF now 55%.  Will continue medications at  current dose and have the patient follow up with Dr. Gala Romney for  routine cardiology visit.  Amyiah had blood work checked on January 21, 2008  with a BMP of 13, potassium 4.1, BUN and creatinine of 10 and 0.8.  I  have offered Sharie to switch her Lasix to p.r.n. dosing but she would like  to continue the 20 mg daily at this time.      Dorian Pod, ACNP  Electronically Signed      Rollene Rotunda, MD, [facility]  Electronically Signed   MB/MedQ  DD: 02/28/2008  DT: 02/28/2008  Job #: [id number]

## 2011-03-22 NOTE — Discharge Summary (Signed)
[name], [name]                  ACCOUNT NO.:  [id number]   MEDICAL RECORD NO.:  [id number]          [name] TYPE:  INP   LOCATION:  2028                         FACILITY:  [facility]   PHYSICIAN:  [name], MDDATE OF BIRTH:  03-20-1976   DATE OF ADMISSION:  10/12/2007  DATE OF DISCHARGE:  10/17/2007                         DISCHARGE SUMMARY - REFERRING   DISCHARGE DIAGNOSIS:  1. 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  2. LV thrombus requiring anticoagulation.  3. Hypertension.  4. Hypokalemia.  5. Mitral regurgitations.  6. Sinus tachycardia.  7. Pericardial effusion.  8. History as noted below.   DISCHARGING PHYSICIAN:  Dr. Gala Romney.   BRIEF HISTORY:  [name] is a 35 year old female who was seen in [name]  office on [name] day of admission by Dr. Myrtis Ser.  For evaluation of shortness  of breath and fluid overload and borderline hypertension.  She has had  mild PND as well as peripheral edema.  After being evaluated she was  admitted for diuresis.  An echocardiogram also showed an ejection  fraction of 20% with moderate pericardial effusion, pleural effusion and  a large mobile clot in [name] left ventricular apex.  Coumadin initiation  was also planned.   PAST MEDICAL HISTORY:  Recent delivery of twins approximately 3 months  ago.   LABORATORIES:  Admission H&H was 11.2 and 34.6, normal indices,  platelets 395, WBCs 9.3 prior to discharge H&H was 12.0 and 37.3, normal  indices, platelets 339, WBCs 6.3.  admission PTT 32, PT 16.2, at [name]  time of discharge PT was 17.2 with INR 1.4.  Admission sodium was 137,  potassium 3.5, BUN and creatinine 1.09, glucose 151.  LFTs were slightly  elevated with a total bilirubin was 1.3, AST 51, ALT of 50.  Prior to  discharge sodium was 137, potassium 4.1, BUN 9, creatinine 0.95, glucose  142.  BNP on admission was 3194.  At [name] time of discharge was 3720.  Fasting lipids showed a total  cholesterol 140, triglycerides 248, HDL  20, LDL 79.  TSH on December 5 was 1.126.  urinalysis showed a moderate  amount of blood.  Chest x-ray on [name] 5th showed marked cardiomegaly with  minimal interstitial edema consistent with early minimal CHF, small  effusions were suspected.  Echocardiogram on December 5, showed an  ejection fraction of 20%, severe diffuse left ventricular hypokinesis,  large mobile thrombus along [name] apical wall of [name] left ventricle,  moderate pericardial effusion circumstantial to [name] heart, moderate MR,  bilateral atrial enlargement, right ventricular enlargement, mild to  moderate reduction of [name] RD.  EKG showed normal sinus rhythm, normal  axis, delayed R-wave, nonspecific ST-T wave changes.  admission weight  was 72.3 kg.  Discharge weight of 54 kg.   HOSPITAL COURSE:  [name] was admitted to [facility] on  October 12, 2007 and placed on IV heparin and Coumadin per pharmacy.  Diuresis was begun.  Nursing noted telemetry which showed normal sinus  rhythm and sinus bradycardia.  On December 7, [name]  felt much  better and was denying orthopnea.  TSH was noted to be within normal  limits.  Progression nurse assisted with discharge planning and  discharge needs for home of Lovenox.  Recheck of brief echo showed an EF  of approximately 30% and Dr. Gala Romney did not view [name] clot on [name]  December 9.  On review on December 10, Dr. Gala Romney felt that [name]  [name] could be discharged home.  Nursing a instructed [name] and  husband on home Lovenox injection.   DISPOSITION:  [name] is discharged home.  She is asked to maintain  low-sodium heart-healthy diet.  Activities were not restricted.  Wound  care is not applicable.   DISCHARGE MEDICATIONS:  1. Coreg 3.125 mg b.i.d.  2. Lisinopril 20 mg daily.  3. Digoxin 0.25 mg daily.  4. Lasix 40 mg daily.  5. K-Dur 20 mEq daily.  6. Coumadin 5 mg daily.  7. Lovenox 65 mg every 12 hours.   She will have a PT/INR in [name]      Coumadin clinic on October 19, 2007, at 2:00 p.m.   FOLLOWUP:  Follow up with Dorian Pod in [name] CHF clinic on December  18 at 2:00 p.m. with supervising physician Dr. Gala Romney.  It is noted,  she will need to follow up echo at some point given her LV thrombus.  She was asked to bring all weights and medications to all appointments.   DISCHARGE TIME:  Fifty five minutes.      Joellyn Rued, PA-C      [name]. Bensimhon, MD  Electronically Signed    EW/MEDQ  D:  10/17/2007  T:  10/17/2007  Job:  [id number]   cc:   [name]. Bensimhon, MD  Randye Lobo, M.D.

## 2011-03-25 NOTE — Discharge Summary (Signed)
[name], [name]                  ACCOUNT NO.:  [id number]   MEDICAL RECORD NO.:  [id number]          PATIENT TYPE:  MAT   LOCATION:  [facility]                          FACILITY:  [facility]   PHYSICIAN:  Malva Limes, M.D.    DATE OF BIRTH:  11/23/1975   DATE OF ADMISSION:  07/19/2007  DATE OF DISCHARGE:  07/19/2007                               DISCHARGE SUMMARY   FINAL DIAGNOSIS:  Intrauterine twin gestation at 22 weeks, pre-term  labor, complications none.  This 35 year old G4, P-1-0-2-1, presents at  33+ weeks' gestation with a known twin gestation.  The patient  complained of mucusy discharge and contractions starting this evening.  Upon examination by Dr. Henderson Cloud in maternity admissions, the patient was  contracting every 5 to 10 minutes.  Her cervix was about 1 meter  dilated, 80% effaced but high.  The patient was of course admitted,  started on mag sulfate and ampicillin 2 grams every 6 hours, was started  on beta betamethasone protocol.  Group B strep culture was obtained as  well as an ultrasound and PIH labs.  The patient was also started on  hospital day #2 on some Procardia 10 mg p.o. every 8 hours.  A NICU  consult was obtained.  Fetal heart tracings were very reactive.  The  patient's contractions resolved, and she was able to be weaned off her  mag sulfate on hospital day #3, and she was kept on oral Procardia.  By  hospital day #4, the patient had received all her betamethasone.  She  was still having some pelvic pain but rare contractions, and blood  pressures were stable.  Her cervix at this point was 2 cm dilated, 50%  effaced at a -2 station.  She was sent home on bedrest, was to return  our office on Friday for an NST, follow up then and of course to call if  anything were to change. Instructions were reviewed with the patient.   LABS ON DISCHARGE:  She had a hemoglobin of 9.2, white blood cell count  of 15.8, platelets of 164,000.  She did have two elevated glucoses,  were  drawn not fasting, just routinely, and did have a normal liver function  test and slightly elevated uric acid.      Leilani Able, P.A.-C.    ______________________________  Malva Limes, M.D.    MB/MEDQ  D:  08/17/2007  T:  08/18/2007  Job:  [id number]

## 2011-04-03 ENCOUNTER — Other Ambulatory Visit: Payer: Self-pay | Admitting: Internal Medicine

## 2011-04-06 ENCOUNTER — Other Ambulatory Visit: Payer: Self-pay | Admitting: *Deleted

## 2011-04-06 ENCOUNTER — Telehealth: Payer: Self-pay | Admitting: Internal Medicine

## 2011-04-06 ENCOUNTER — Encounter: Payer: Self-pay | Admitting: Internal Medicine

## 2011-04-06 MED ORDER — POTASSIUM CHLORIDE CRYS ER 20 MEQ PO TBCR: 20.0000 meq | EXTENDED_RELEASE_TABLET | Freq: Every day | ORAL | Status: DC

## 2011-04-06 MED ORDER — LISINOPRIL 10 MG PO TABS: 10.0000 mg | ORAL_TABLET | Freq: Every day | ORAL | Status: DC

## 2011-04-06 NOTE — Telephone Encounter (Signed)
Spoke with patient. Meds refilled. HTF

## 2011-04-06 NOTE — Telephone Encounter (Signed)
Pt returning your call

## 2011-04-06 NOTE — Telephone Encounter (Signed)
Pt returning cal to holly-pls call [phone]

## 2011-04-20 ENCOUNTER — Encounter: Payer: Self-pay | Admitting: Internal Medicine

## 2011-04-20 ENCOUNTER — Ambulatory Visit (INDEPENDENT_AMBULATORY_CARE_PROVIDER_SITE_OTHER): Payer: Managed Care, Other (non HMO) | Admitting: Internal Medicine

## 2011-04-20 VITALS — BP 116/84 | HR 87 | Ht 68.0 in | Wt 207.1 lb

## 2011-04-20 DIAGNOSIS — I428 Other cardiomyopathies: Secondary | ICD-10-CM

## 2011-04-20 MED ORDER — CARVEDILOL 6.25 MG PO TABS: 6.2500 mg | ORAL_TABLET | Freq: Two times a day (BID) | ORAL | Status: DC

## 2011-04-20 MED ORDER — FUROSEMIDE 20 MG PO TABS: 20.0000 mg | ORAL_TABLET | Freq: Every day | ORAL | Status: DC

## 2011-04-20 MED ORDER — POTASSIUM CHLORIDE CRYS ER 20 MEQ PO TBCR: 20.0000 meq | EXTENDED_RELEASE_TABLET | Freq: Every day | ORAL | Status: DC

## 2011-04-20 MED ORDER — LISINOPRIL 10 MG PO TABS: 10.0000 mg | ORAL_TABLET | Freq: Every day | ORAL | Status: DC

## 2011-04-20 NOTE — Assessment & Plan Note (Signed)
Resolved. Doing well. We discussed possibly stopping lisinopril but we have decided to continue. Continue yearly f/u.

## 2011-04-20 NOTE — Patient Instructions (Signed)
Your physician wants you to follow-up in: 1 year. You will receive a reminder letter in the mail two months in advance. If you don't receive a letter, please call our office to schedule the follow-up appointment.  

## 2011-04-20 NOTE — Progress Notes (Signed)
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  Fortunately, her most recent echocardiogram in June 2009 and 2010 has shown a complete recovery of her LV function with an EF of 55-60%.   She returns for yearly f/u. Doing well from caradiac standpoint. 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 No CP or SOB. Occasionally tired. If stop lasix does get a little bit of swelling. Compliant with all meds. Mild episode of chest tightness this last week but no recurrence. Not planning any further preganancies.   ROS: All systems negative except as listed in HPI, PMH and Problem List.  Past Medical History  Diagnosis Date  . CHF (congestive heart failure)     h/o peripartum CM EF 20%, onset 12/08. LV function recovered EF 60% (6/09)   . Post-infarction apical thrombus     resovled  . HTN (hypertension)     Current Outpatient Prescriptions  Medication Sig Dispense Refill  . carvedilol (COREG) 6.25 MG tablet Take 6.25 mg by mouth 2 (two) times daily.        . furosemide (LASIX) 20 MG tablet Take 20 mg by mouth daily.        Marland Kitchen lisinopril (PRINIVIL,ZESTRIL) 10 MG tablet        . potassium chloride SA (K-DUR,KLOR-CON) 20 MEQ tablet TAKE 1 TABLET BY MOUTH EVERY DAY  30 tablet  0  . DISCONTD: lisinopril (PRINIVIL,ZESTRIL) 10 MG tablet TAKE 1 TABLET BY MOUTH EVERY DAY  30 tablet  0  . DISCONTD: lisinopril (PRINIVIL,ZESTRIL) 10 MG tablet Take 10 mg by mouth daily. Take 1/2 tablet       . DISCONTD: lisinopril (PRINIVIL,ZESTRIL) 10 MG tablet Take 1 tablet (10 mg total) by mouth daily.  30 tablet  11  . DISCONTD: potassium chloride SA (K-DUR,KLOR-CON) 20 MEQ tablet Take 20 mEq by mouth daily.        Marland Kitchen DISCONTD: potassium chloride SA (KLOR-CON M20) 20 MEQ tablet Take 1 tablet (20 mEq total) by mouth  daily.  30 tablet  11     PHYSICAL EXAM: Filed Vitals:   04/20/11 1048  BP: 116/84  Pulse: 87   General:  Well appearing. No resp difficulty HEENT: normal Neck: supple. JVP flat. Carotids 2+ bilaterally; no bruits. No lymphadenopathy or thryomegaly appreciated. Cor: PMI normal. Regular rate & rhythm. No rubs, gallops or murmurs. Lungs: clear Abdomen: soft, nontender, nondistended. No hepatosplenomegaly. No bruits or masses. Good bowel sounds. Extremities: no cyanosis, clubbing, rash, edema Neuro: alert & orientedx3, cranial nerves grossly intact. Moves all 4 extremities w/o difficulty. Affect pleasant.   ECG: NSR 87 No ST-T wave abnormalities.     ASSESSMENT & PLAN:

## 2011-04-22 ENCOUNTER — Telehealth: Payer: Self-pay | Admitting: Internal Medicine

## 2011-04-22 MED ORDER — FUROSEMIDE 20 MG PO TABS: 20.0000 mg | ORAL_TABLET | Freq: Every day | ORAL | Status: DC

## 2011-04-22 NOTE — Telephone Encounter (Signed)
Pt's furosimide 20mg  uses [facility] was to be called in with three others but this one was not on the list

## 2011-06-24 ENCOUNTER — Other Ambulatory Visit: Payer: Self-pay | Admitting: Obstetrics and Gynecology

## 2011-07-29 LAB — COMPREHENSIVE METABOLIC PANEL
ALT: 12
AST: 27
Albumin: 3.9
BUN: 9
CO2: 25
Calcium: 8.9
Chloride: 102
Creatinine, Ser: 0.73
GFR calc Af Amer: >60
Glucose, Bld: 117 — ABNORMAL HIGH
Total Protein: 6.7

## 2011-07-29 LAB — DIGOXIN LEVEL: Digoxin Level: 0.6 — ABNORMAL LOW

## 2011-07-29 LAB — B-NATRIURETIC PEPTIDE (CONVERTED LAB): Pro B Natriuretic peptide (BNP): <30

## 2011-07-29 LAB — CBC
HCT: 33.4 — ABNORMAL LOW
Hemoglobin: 11.5 — ABNORMAL LOW
MCHC: 34.4
RBC: 4.21
RDW: 23 — ABNORMAL HIGH

## 2011-07-29 LAB — DIFFERENTIAL
Basophils Absolute: 0
Basophils Relative: 1
Eosinophils Absolute: 0.1
Eosinophils Relative: 2
Lymphocytes Relative: 42
Monocytes Absolute: 0.3

## 2011-07-29 LAB — POCT CARDIAC MARKERS: Troponin i, poc: <0.05

## 2011-08-15 LAB — BASIC METABOLIC PANEL
BUN: 10
BUN: 10
BUN: 12
BUN: 8
BUN: 9
CO2: 25
CO2: 29
CO2: 31
CO2: 31
Calcium: 8.4
Calcium: 9.1
Chloride: 100
Chloride: 102
Creatinine, Ser: 0.99
Creatinine, Ser: 1.01
Creatinine, Ser: 1.02
GFR calc non Af Amer: >60
GFR calc non Af Amer: >60
Glucose, Bld: 139 — ABNORMAL HIGH
Glucose, Bld: 142 — ABNORMAL HIGH
Glucose, Bld: 142 — ABNORMAL HIGH
Glucose, Bld: 158 — ABNORMAL HIGH
Potassium: 3.9
Potassium: 4
Potassium: 4.1
Sodium: 138

## 2011-08-15 LAB — CBC
HCT: 32.8 — ABNORMAL LOW
HCT: 33.4 — ABNORMAL LOW
HCT: 34.6 — ABNORMAL LOW
HCT: 35.2 — ABNORMAL LOW
HCT: 37.3
Hemoglobin: 10.8 — ABNORMAL LOW
Hemoglobin: 11.2 — ABNORMAL LOW
Hemoglobin: 11.2 — ABNORMAL LOW
Hemoglobin: 12
MCHC: 31.8
MCHC: 32.3
MCV: 78.3
MCV: 79.3
MCV: 79.4
Platelets: 308
Platelets: 350
Platelets: 397
RBC: 4.36
RBC: 4.69
RDW: 15.3
RDW: 15.5
RDW: 15.7 — ABNORMAL HIGH
RDW: 16 — ABNORMAL HIGH
WBC: 6.3
WBC: 8.6
WBC: 9.3

## 2011-08-15 LAB — COMPREHENSIVE METABOLIC PANEL
Albumin: 3.3 — ABNORMAL LOW
Alkaline Phosphatase: 100
BUN: 10
CO2: 25
Chloride: 103
Creatinine, Ser: 1.09
GFR calc non Af Amer: 58 — ABNORMAL LOW
Potassium: 3.5
Total Bilirubin: 1.3 — ABNORMAL HIGH

## 2011-08-15 LAB — PROTIME-INR
INR: 1.1
INR: 1.3
INR: 1.3
Prothrombin Time: 14.4
Prothrombin Time: 16.2 — ABNORMAL HIGH

## 2011-08-15 LAB — HEPARIN LEVEL (UNFRACTIONATED)
Heparin Unfractionated: 0.27 — ABNORMAL LOW
Heparin Unfractionated: 0.33

## 2011-08-15 LAB — LIPID PANEL
Cholesterol: 140
HDL: 20 — ABNORMAL LOW
Total CHOL/HDL Ratio: 7
Triglycerides: 204 — ABNORMAL HIGH

## 2011-08-15 LAB — B-NATRIURETIC PEPTIDE (CONVERTED LAB): Pro B Natriuretic peptide (BNP): 3194 — ABNORMAL HIGH

## 2011-08-19 LAB — COMPREHENSIVE METABOLIC PANEL
ALT: 16
ALT: 17
AST: 29
AST: 29
AST: 32
AST: 36
Albumin: 1.8 — ABNORMAL LOW
Albumin: 2.2 — ABNORMAL LOW
Albumin: 2.3 — ABNORMAL LOW
Albumin: 2.3 — ABNORMAL LOW
Albumin: 2.3 — ABNORMAL LOW
Alkaline Phosphatase: 110
Alkaline Phosphatase: 81
Alkaline Phosphatase: 84
Alkaline Phosphatase: 99
BUN: 1 — ABNORMAL LOW
BUN: 2 — ABNORMAL LOW
BUN: 3 — ABNORMAL LOW
BUN: 5 — ABNORMAL LOW
CO2: 19
CO2: 22
CO2: 23
Calcium: 8.1 — ABNORMAL LOW
Calcium: 9.3
Chloride: 102
Chloride: 104
Chloride: 105
Chloride: 105
Chloride: 105
Chloride: 107
Creatinine, Ser: 0.68
Creatinine, Ser: 0.69
Creatinine, Ser: 0.7
Creatinine, Ser: 0.76
Creatinine, Ser: 0.88
GFR calc Af Amer: >60
GFR calc Af Amer: >60
GFR calc Af Amer: >60
GFR calc Af Amer: >60
GFR calc non Af Amer: >60
GFR calc non Af Amer: >60
GFR calc non Af Amer: >60
Glucose, Bld: 102 — ABNORMAL HIGH
Glucose, Bld: 109 — ABNORMAL HIGH
Potassium: 3.6
Potassium: 3.8
Potassium: 3.9
Potassium: 4.3
Sodium: 135
Sodium: 137
Total Bilirubin: 0.4
Total Bilirubin: 0.4
Total Bilirubin: 0.5
Total Bilirubin: 0.5
Total Bilirubin: 0.5
Total Bilirubin: 0.5
Total Protein: 5.3 — ABNORMAL LOW
Total Protein: 5.5 — ABNORMAL LOW
Total Protein: 5.5 — ABNORMAL LOW

## 2011-08-19 LAB — CBC
HCT: 21.1 — ABNORMAL LOW
HCT: 23 — ABNORMAL LOW
HCT: 26 — ABNORMAL LOW
HCT: 27.3 — ABNORMAL LOW
HCT: 27.3 — ABNORMAL LOW
Hemoglobin: 10 — ABNORMAL LOW
Hemoglobin: 7.5 — CL
Hemoglobin: 8.1 — ABNORMAL LOW
Hemoglobin: 9.2 — ABNORMAL LOW
MCHC: 35.4
MCHC: 35.6
MCHC: 36.7 — ABNORMAL HIGH
MCV: 89.6
MCV: 91
MCV: 91.2
MCV: 91.6
MCV: 91.6
MCV: 92.2
Platelets: 145 — ABNORMAL LOW
Platelets: 149 — ABNORMAL LOW
Platelets: 169
Platelets: 175
Platelets: 363
RBC: 2.3 — ABNORMAL LOW
RBC: 2.53 — ABNORMAL LOW
RBC: 2.82 — ABNORMAL LOW
RBC: 3.05 — ABNORMAL LOW
RDW: 13.3
RDW: 13.3
RDW: 13.4
RDW: 13.4
RDW: 13.5
WBC: 10.5
WBC: 10.6 — ABNORMAL HIGH
WBC: 12.7 — ABNORMAL HIGH
WBC: 15.8 — ABNORMAL HIGH
WBC: 18.6 — ABNORMAL HIGH
WBC: 8.9

## 2011-08-19 LAB — URINALYSIS, ROUTINE W REFLEX MICROSCOPIC
Bilirubin Urine: NEGATIVE
Glucose, UA: NEGATIVE
Glucose, UA: NEGATIVE
Hgb urine dipstick: NEGATIVE
Ketones, ur: NEGATIVE
Leukocytes, UA: NEGATIVE
Nitrite: NEGATIVE
Protein, ur: NEGATIVE
Specific Gravity, Urine: 1.015
Specific Gravity, Urine: <1.005 — ABNORMAL LOW
Urobilinogen, UA: 0.2
Urobilinogen, UA: 0.2
pH: 6.5

## 2011-08-19 LAB — URINE MICROSCOPIC-ADD ON

## 2011-08-19 LAB — LACTATE DEHYDROGENASE
LDH: 146
LDH: 181

## 2011-08-19 LAB — URIC ACID
Uric Acid, Serum: 7.2 — ABNORMAL HIGH
Uric Acid, Serum: 7.3 — ABNORMAL HIGH
Uric Acid, Serum: 7.4 — ABNORMAL HIGH
Uric Acid, Serum: 7.5 — ABNORMAL HIGH

## 2011-08-19 LAB — MAGNESIUM: Magnesium: 4.7 — ABNORMAL HIGH

## 2011-08-19 LAB — RPR
RPR Ser Ql: NONREACTIVE
RPR Ser Ql: NONREACTIVE

## 2011-08-19 LAB — STREP B DNA PROBE: Strep Group B Ag: NEGATIVE

## 2012-03-13 ENCOUNTER — Other Ambulatory Visit (HOSPITAL_COMMUNITY): Payer: Self-pay | Admitting: Internal Medicine

## 2012-05-30 ENCOUNTER — Encounter: Payer: Self-pay | Admitting: Internal Medicine

## 2012-05-30 ENCOUNTER — Ambulatory Visit (INDEPENDENT_AMBULATORY_CARE_PROVIDER_SITE_OTHER): Payer: Managed Care, Other (non HMO) | Admitting: Internal Medicine

## 2012-05-30 VITALS — BP 125/80 | HR 71 | Ht 68.0 in | Wt 217.4 lb

## 2012-05-30 DIAGNOSIS — I428 Other cardiomyopathies: Secondary | ICD-10-CM

## 2012-05-30 MED ORDER — FUROSEMIDE 20 MG PO TABS: 20.0000 mg | ORAL_TABLET | Freq: Every day | ORAL | Status: DC

## 2012-05-30 MED ORDER — CARVEDILOL 6.25 MG PO TABS: 6.2500 mg | ORAL_TABLET | Freq: Two times a day (BID) | ORAL | Status: DC

## 2012-05-30 MED ORDER — POTASSIUM CHLORIDE CRYS ER 20 MEQ PO TBCR: 20.0000 meq | EXTENDED_RELEASE_TABLET | Freq: Every day | ORAL | Status: DC

## 2012-05-30 MED ORDER — LISINOPRIL 10 MG PO TABS: 10.0000 mg | ORAL_TABLET | Freq: Every day | ORAL | Status: DC

## 2012-05-30 NOTE — Patient Instructions (Addendum)
Your physician has requested that you have an echocardiogram. Echocardiography is a painless test that uses sound waves to create images of your heart. It provides your doctor with information about the size and shape of your heart and how well your heart's chambers and valves are working. This procedure takes approximately one hour. There are no restrictions for this procedure.  Your physician wants you to follow-up in: 1 year with Dr. Gala Romney.  You will receive a reminder letter in the mail two months in advance. If you don't receive a letter, please call our office to schedule the follow-up appointment.

## 2012-05-30 NOTE — Addendum Note (Signed)
Addended by: Waymon Budge on: 05/30/2012 09:38 AM   Modules accepted: Orders

## 2012-05-30 NOTE — Progress Notes (Signed)
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  Fortunately, her most recent echocardiogram in June 2009 and 2010 has shown a complete recovery of her LV function with an EF of 55-60%.   She returns for yearly f/u. Doing well from caradiac standpoint. 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    No CP or SOB. Occasionally tired. If stop lasix does get a little bit of swelling. Compliant with all meds. Not planning any further preganancies. Due to have IUD out in October.   ROS: All systems negative except as listed in HPI, PMH and Problem List.  Past Medical History  Diagnosis Date  . CHF (congestive heart failure)     h/o peripartum CM EF 20%, onset 12/08. LV function recovered EF 60% (6/09)   . Post-infarction apical thrombus     resovled  . HTN (hypertension)     Current Outpatient Prescriptions  Medication Sig Dispense Refill  . carvedilol (COREG) 6.25 MG tablet Take 1 tablet (6.25 mg total) by mouth 2 (two) times daily.  180 tablet  3  . furosemide (LASIX) 20 MG tablet TAKE 1 TABLET (=20 MG      TOTAL) DAILY  90 tablet  3  . KLOR-CON M20 20 MEQ tablet TAKE 1 TABLET DAILY  30 each  3  . lisinopril (PRINIVIL,ZESTRIL) 10 MG tablet TAKE 1 TABLET DAILY  90 tablet  3     PHYSICAL EXAM: Filed Vitals:   05/30/12 0902  BP: 125/80  Pulse: 71   General:  Well appearing. No resp difficulty HEENT: normal Neck: supple. JVP flat. Carotids 2+ bilaterally; no bruits. No lymphadenopathy or thryomegaly appreciated. Cor: PMI normal. Regular rate & rhythm. No rubs, gallops or murmurs. Lungs: clear Abdomen: soft, nontender, nondistended. No hepatosplenomegaly. No bruits or masses. Good bowel sounds. Extremities: no cyanosis, clubbing, rash,  edema Neuro: alert & orientedx3, cranial nerves grossly intact. Moves all 4 extremities w/o difficulty. Affect pleasant.   ECG: NSR 71 No ST-T wave abnormalities.     ASSESSMENT & PLAN:

## 2012-05-30 NOTE — Assessment & Plan Note (Signed)
Resolved. Doing well. Will check f/u echo. She wants to continue current meds which are also controlling her BP. Continue yearly f/u.

## 2012-06-13 ENCOUNTER — Ambulatory Visit (HOSPITAL_COMMUNITY): Payer: Managed Care, Other (non HMO) | Attending: Cardiovascular Disease | Admitting: Radiology

## 2012-06-13 DIAGNOSIS — I10 Essential (primary) hypertension: Secondary | ICD-10-CM | POA: Insufficient documentation

## 2012-06-13 DIAGNOSIS — I428 Other cardiomyopathies: Secondary | ICD-10-CM | POA: Insufficient documentation

## 2012-06-13 DIAGNOSIS — I509 Heart failure, unspecified: Secondary | ICD-10-CM | POA: Insufficient documentation

## 2012-06-13 DIAGNOSIS — E669 Obesity, unspecified: Secondary | ICD-10-CM | POA: Insufficient documentation

## 2012-06-13 NOTE — Progress Notes (Signed)
Echocardiogram performed.  

## 2012-06-15 ENCOUNTER — Telehealth: Payer: Self-pay | Admitting: Internal Medicine

## 2012-06-15 NOTE — Telephone Encounter (Signed)
New msg Pt wants to know Echo results. Please call

## 2012-06-15 NOTE — Telephone Encounter (Signed)
Spoke with pt, aware of normal echo results 

## 2013-04-04 ENCOUNTER — Other Ambulatory Visit (HOSPITAL_COMMUNITY): Payer: Self-pay | Admitting: *Deleted

## 2013-04-04 MED ORDER — FUROSEMIDE 20 MG PO TABS: 20.0000 mg | ORAL_TABLET | Freq: Every day | ORAL | Status: DC

## 2013-04-04 MED ORDER — LISINOPRIL 10 MG PO TABS: 10.0000 mg | ORAL_TABLET | Freq: Every day | ORAL | Status: DC

## 2014-03-19 ENCOUNTER — Other Ambulatory Visit (HOSPITAL_COMMUNITY): Payer: Self-pay | Admitting: Internal Medicine

## 2014-04-07 ENCOUNTER — Ambulatory Visit (HOSPITAL_COMMUNITY)
Admission: RE | Admit: 2014-04-07 | Discharge: 2014-04-07 | Disposition: A | Discharge status: Home / Self Care | Payer: Managed Care, Other (non HMO) | Source: Ambulatory Visit | Attending: Internal Medicine | Admitting: Internal Medicine

## 2014-04-07 VITALS — BP 146/78 | HR 85 | Wt 210.5 lb

## 2014-04-07 DIAGNOSIS — I428 Other cardiomyopathies: Secondary | ICD-10-CM

## 2014-04-07 DIAGNOSIS — I10 Essential (primary) hypertension: Secondary | ICD-10-CM | POA: Insufficient documentation

## 2014-04-07 DIAGNOSIS — O903 Peripartum cardiomyopathy: Secondary | ICD-10-CM | POA: Insufficient documentation

## 2014-04-07 DIAGNOSIS — I509 Heart failure, unspecified: Secondary | ICD-10-CM | POA: Insufficient documentation

## 2014-04-07 MED ORDER — CARVEDILOL 6.25 MG PO TABS: 6.2500 mg | ORAL_TABLET | Freq: Two times a day (BID) | ORAL | Status: DC

## 2014-04-07 MED ORDER — POTASSIUM CHLORIDE CRYS ER 20 MEQ PO TBCR: 20.0000 meq | EXTENDED_RELEASE_TABLET | Freq: Every day | ORAL | Status: DC

## 2014-04-07 MED ORDER — LISINOPRIL 10 MG PO TABS: 10.0000 mg | ORAL_TABLET | Freq: Every day | ORAL | Status: DC

## 2014-04-07 MED ORDER — FUROSEMIDE 20 MG PO TABS: 20.0000 mg | ORAL_TABLET | Freq: Every day | ORAL | Status: DC

## 2014-04-07 NOTE — Progress Notes (Signed)
Patient ID: [name], female   DOB: 1976-04-27, 38 y.o.   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  Most recent ECHO  June 2009 and 2010 has shown a complete recovery of her LV function with an EF of 55-60%.   ECHO 2013 EF 55-60%   She returns for yearly follow up. Denies SOB/PND/Orthopnea.  Compliant with all meds. Not planning any further preganancies. Has Mirena.    SH: Works full time at [employer]  ROS: All systems negative except as listed in HPI, PMH and Problem List.  Past Medical History  Diagnosis Date  . CHF (congestive heart failure)     h/o peripartum CM EF 20%, onset 12/08. LV function recovered EF 60% (6/09)   . Post-infarction apical thrombus     resovled  . HTN (hypertension)     Current Outpatient Prescriptions  Medication Sig Dispense Refill  . carvedilol (COREG) 6.25 MG tablet Take 1 tablet (6.25 mg total) by mouth 2 (two) times daily.  180 tablet  3  . furosemide (LASIX) 20 MG tablet Take 1 tablet (20 mg total) by mouth daily. MUST HAVE FOLLOW UP APPOINTMENT FOR FURTHER REFILLS  30 tablet  0  . lisinopril (PRINIVIL,ZESTRIL) 10 MG tablet Take 1 tablet (10 mg total) by mouth daily. MUST HAVE FOLLOW UP APPOINTMENT FOR FURTHER REFILLS  30 tablet  0  . potassium chloride SA (KLOR-CON M20) 20 MEQ tablet Take 1 tablet (20 mEq total) by mouth daily.  90 tablet  3   No current facility-administered medications for this encounter.     PHYSICAL EXAM: Filed Vitals:   04/07/14 1352  BP: 146/78  Pulse: 85   General:  Well appearing. No resp difficulty HEENT: normal Neck: supple. JVP flat. Carotids 2+ bilaterally; no bruits. No lymphadenopathy or thryomegaly appreciated. Cor: PMI normal. Regular rate & rhythm. No rubs, gallops or murmurs. Lungs: clear Abdomen: soft, nontender,  nondistended. No hepatosplenomegaly. No bruits or masses. Good bowel sounds. Extremities: no cyanosis, clubbing, rash, edema Neuro: alert & orientedx3, cranial nerves grossly intact. Moves all 4 extremities w/o difficulty. Affect pleasant.   ASSESSMENT & PLAN:  1. Pregnancy Induced Cardiomyopathy ECHO 2013 EF 55-60% - resolved NYHA I. Volume status stable. Lasix 20 mg daily and 20 meq potassium Continue carvedilol 6.25 mg twice a day and lisinopril 10 mg daily.   [name] Clegg NP-C  Patient seen and examined with [name] Becket, NP. We discussed all aspects of the encounter. I agree with the assessment and plan as stated above.   She continues to do very well. No evidence recurrent HF. We discussed cutting back on her meds but she would like to continue. I think this is reasonable. Will see back next year with f/u echo.  [name] [name] Arave,MD 9:44 PM

## 2014-04-07 NOTE — Patient Instructions (Signed)
Follow up in 1 year with an ECHO 

## 2016-02-13 ENCOUNTER — Observation Stay (HOSPITAL_COMMUNITY)
Admission: EM | Admit: 2016-02-13 | Discharge: 2016-02-14 | Disposition: A | Discharge status: Home / Self Care | Payer: Managed Care, Other (non HMO) | Attending: Internal Medicine | Admitting: Internal Medicine

## 2016-02-13 ENCOUNTER — Encounter (HOSPITAL_COMMUNITY): Payer: Self-pay | Admitting: Emergency Medicine

## 2016-02-13 ENCOUNTER — Emergency Department (HOSPITAL_COMMUNITY): Payer: Managed Care, Other (non HMO)

## 2016-02-13 DIAGNOSIS — E663 Overweight: Secondary | ICD-10-CM | POA: Diagnosis not present

## 2016-02-13 DIAGNOSIS — O903 Peripartum cardiomyopathy: Secondary | ICD-10-CM | POA: Diagnosis present

## 2016-02-13 DIAGNOSIS — R1013 Epigastric pain: Secondary | ICD-10-CM | POA: Insufficient documentation

## 2016-02-13 DIAGNOSIS — I10 Essential (primary) hypertension: Secondary | ICD-10-CM | POA: Diagnosis not present

## 2016-02-13 DIAGNOSIS — R739 Hyperglycemia, unspecified: Secondary | ICD-10-CM

## 2016-02-13 DIAGNOSIS — T68XXXA Hypothermia, initial encounter: Secondary | ICD-10-CM | POA: Insufficient documentation

## 2016-02-13 DIAGNOSIS — Z79899 Other long term (current) drug therapy: Secondary | ICD-10-CM | POA: Diagnosis not present

## 2016-02-13 DIAGNOSIS — I509 Heart failure, unspecified: Secondary | ICD-10-CM | POA: Insufficient documentation

## 2016-02-13 DIAGNOSIS — Z87891 Personal history of nicotine dependence: Secondary | ICD-10-CM | POA: Diagnosis not present

## 2016-02-13 DIAGNOSIS — R4182 Altered mental status, unspecified: Secondary | ICD-10-CM | POA: Diagnosis present

## 2016-02-13 DIAGNOSIS — E876 Hypokalemia: Secondary | ICD-10-CM

## 2016-02-13 DIAGNOSIS — X31XXXA Exposure to excessive natural cold, initial encounter: Secondary | ICD-10-CM | POA: Diagnosis not present

## 2016-02-13 DIAGNOSIS — R55 Syncope and collapse: Secondary | ICD-10-CM

## 2016-02-13 DIAGNOSIS — I236 Thrombosis of atrium, auricular appendage, and ventricle as current complications following acute myocardial infarction: Secondary | ICD-10-CM | POA: Diagnosis not present

## 2016-02-13 HISTORY — DX: Hyperglycemia, unspecified: R73.9

## 2016-02-13 HISTORY — DX: Peripartum cardiomyopathy: O90.3

## 2016-02-13 HISTORY — DX: Hypokalemia: E87.6

## 2016-02-13 HISTORY — DX: Syncope and collapse: R55

## 2016-02-13 LAB — CBC WITH DIFFERENTIAL/PLATELET
BASOS PCT: 0 %
Basophils Absolute: 0 10*3/uL (ref 0.0–0.1)
EOS ABS: 0.1 10*3/uL (ref 0.0–0.7)
Eosinophils Relative: 1 %
HEMATOCRIT: 38.3 % (ref 36.0–46.0)
Hemoglobin: 13 g/dL (ref 12.0–15.0)
LYMPHS ABS: 2.2 10*3/uL (ref 0.7–4.0)
Lymphocytes Relative: 24 %
MCH: 30.1 pg (ref 26.0–34.0)
MCHC: 33.9 g/dL (ref 30.0–36.0)
MCV: 88.7 fL (ref 78.0–100.0)
MONO ABS: 0.2 10*3/uL (ref 0.1–1.0)
MONOS PCT: 2 %
Neutro Abs: 6.8 10*3/uL (ref 1.7–7.7)
Neutrophils Relative %: 73 %
Platelets: 273 10*3/uL (ref 150–400)
RBC: 4.32 MIL/uL (ref 3.87–5.11)
RDW: 12.3 % (ref 11.5–15.5)
WBC: 9.3 10*3/uL (ref 4.0–10.5)

## 2016-02-13 LAB — COMPREHENSIVE METABOLIC PANEL
ALK PHOS: 54 U/L (ref 38–126)
ALT: 16 U/L (ref 14–54)
ANION GAP: 16 — AB (ref 5–15)
AST: 24 U/L (ref 15–41)
Albumin: 3.7 g/dL (ref 3.5–5.0)
BILIRUBIN TOTAL: 0.6 mg/dL (ref 0.3–1.2)
BUN: 7 mg/dL (ref 6–20)
CALCIUM: 8.7 mg/dL — AB (ref 8.9–10.3)
CO2: 16 mmol/L — ABNORMAL LOW (ref 22–32)
CREATININE: 1 mg/dL (ref 0.44–1.00)
Chloride: 109 mmol/L (ref 101–111)
Glucose, Bld: 231 mg/dL — ABNORMAL HIGH (ref 65–99)
Potassium: 3.3 mmol/L — ABNORMAL LOW (ref 3.5–5.1)
Sodium: 141 mmol/L (ref 135–145)
TOTAL PROTEIN: 6.2 g/dL — AB (ref 6.5–8.1)

## 2016-02-13 LAB — LIPASE, BLOOD: Lipase: 32 U/L (ref 11–51)

## 2016-02-13 LAB — CBG MONITORING, ED: GLUCOSE-CAPILLARY: 266 mg/dL — AB (ref 65–99)

## 2016-02-13 LAB — I-STAT BETA HCG BLOOD, ED (MC, WL, AP ONLY): I-stat hCG, quantitative: 8.9 m[IU]/mL — ABNORMAL HIGH (ref <5)

## 2016-02-13 LAB — HCG, QUANTITATIVE, PREGNANCY: HCG, BETA CHAIN, QUANT, S: 2 m[IU]/mL (ref <5)

## 2016-02-13 LAB — LACTIC ACID, PLASMA
Lactic Acid, Venous: 2 mmol/L (ref 0.5–2.0)
Lactic Acid, Venous: 1.6 mmol/L (ref 0.5–2.0)

## 2016-02-13 LAB — I-STAT CG4 LACTIC ACID, ED
LACTIC ACID, VENOUS: 4.66 mmol/L — AB (ref 0.5–2.0)
LACTIC ACID, VENOUS: 1.47 mmol/L (ref 0.5–2.0)
Lactic Acid, Venous: 1.83 mmol/L (ref 0.5–2.0)

## 2016-02-13 LAB — PROCALCITONIN: PROCALCITONIN: 1.86 ng/mL

## 2016-02-13 LAB — TROPONIN I
TROPONIN I: 0.04 ng/mL — AB (ref <0.031)
TROPONIN I: 0.03 ng/mL (ref <0.031)
Troponin I: <0.03 ng/mL (ref <0.031)

## 2016-02-13 LAB — MAGNESIUM: MAGNESIUM: 1.7 mg/dL (ref 1.7–2.4)

## 2016-02-13 MED ORDER — PIPERACILLIN-TAZOBACTAM 3.375 G IVPB 30 MIN
3.3750 g | Freq: Once | INTRAVENOUS | Status: AC
  Administered 2016-02-13: 3.375 g via INTRAVENOUS
  Filled 2016-02-13: qty 50

## 2016-02-13 MED ORDER — LISINOPRIL 10 MG PO TABS
10.0000 mg | ORAL_TABLET | Freq: Every day | ORAL | Status: DC
  Administered 2016-02-13 – 2016-02-14 (×2): 10 mg via ORAL
  Filled 2016-02-13 (×2): qty 1

## 2016-02-13 MED ORDER — POTASSIUM CHLORIDE 10 MEQ/100ML IV SOLN: 10.0000 meq | Freq: Once | INTRAVENOUS | Status: DC

## 2016-02-13 MED ORDER — VANCOMYCIN HCL IN DEXTROSE 1-5 GM/200ML-% IV SOLN
1000.0000 mg | Freq: Once | INTRAVENOUS | Status: AC
  Administered 2016-02-13: 1000 mg via INTRAVENOUS
  Filled 2016-02-13: qty 200

## 2016-02-13 MED ORDER — PIPERACILLIN-TAZOBACTAM 3.375 G IVPB
3.3750 g | Freq: Three times a day (TID) | INTRAVENOUS | Status: DC
  Administered 2016-02-13 – 2016-02-14 (×2): 3.375 g via INTRAVENOUS
  Filled 2016-02-13 (×4): qty 50

## 2016-02-13 MED ORDER — MAGNESIUM SULFATE 2 GM/50ML IV SOLN
2.0000 g | Freq: Once | INTRAVENOUS | Status: DC
  Filled 2016-02-13: qty 50

## 2016-02-13 MED ORDER — VANCOMYCIN HCL IN DEXTROSE 1-5 GM/200ML-% IV SOLN
1000.0000 mg | Freq: Two times a day (BID) | INTRAVENOUS | Status: DC
  Administered 2016-02-14: 1000 mg via INTRAVENOUS
  Filled 2016-02-13 (×2): qty 200

## 2016-02-13 MED ORDER — MAGNESIUM SULFATE 2 GM/50ML IV SOLN
2.0000 g | Freq: Once | INTRAVENOUS | Status: AC
  Administered 2016-02-13 (×2): 2 g via INTRAVENOUS

## 2016-02-13 MED ORDER — SODIUM CHLORIDE 0.9 % IV BOLUS (SEPSIS): 1000.0000 mL | Freq: Once | INTRAVENOUS | Status: DC

## 2016-02-13 MED ORDER — POTASSIUM CHLORIDE CRYS ER 20 MEQ PO TBCR
40.0000 meq | EXTENDED_RELEASE_TABLET | Freq: Once | ORAL | Status: AC
  Administered 2016-02-13: 40 meq via ORAL
  Filled 2016-02-13: qty 2

## 2016-02-13 MED ORDER — ACETAMINOPHEN 325 MG PO TABS
650.0000 mg | ORAL_TABLET | Freq: Once | ORAL | Status: AC
  Administered 2016-02-13: 650 mg via ORAL
  Filled 2016-02-13: qty 2

## 2016-02-13 MED ORDER — CARVEDILOL 6.25 MG PO TABS
6.2500 mg | ORAL_TABLET | Freq: Two times a day (BID) | ORAL | Status: DC
  Administered 2016-02-14: 6.25 mg via ORAL
  Filled 2016-02-13: qty 1

## 2016-02-13 NOTE — Plan of Care (Signed)
Problem: Pain Managment: Goal: General experience of comfort will improve Outcome: Completed/Met Date Met:  02/13/16 Pt educated on pain scale and pain interventions. Pt verbalized understanding. Pt has does not have any c/o pain at this time.

## 2016-02-13 NOTE — Progress Notes (Signed)
ANTIBIOTIC CONSULT NOTE - INITIAL  Pharmacy Consult for Vancomycin/Zosyn Indication:  Mental status change, hypotensive - r/o sepsis  No Known Allergies  Patient Measurements: Height: 5\' 7"  (170.2 cm) Weight: 190 lb (86.183 kg) IBW/kg (Calculated) : 61.6 Adjusted Body Weight: 71.4 kg Percent over/under ibw: 39.94 %  Vital Signs: Temp: 92.9 F (33.8 C) (04/08 1109) Temp Source: Rectal (04/08 1109) BP: 118/63 mmHg (04/08 1102) Pulse Rate: 94 (04/08 1102)  Labs: No results for input(s): WBC, HGB, PLT, LABCREA, CREATININE in the last 72 hours.  Microbiology: No results found for this or any previous visit (from the past 720 hour(s)).  Medical History: Past Medical History  Diagnosis Date  . 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 LV function recovered EF 60% (6/09)   . Post-infarction apical thrombus (HCC)     resovled  . HTN (hypertension)    Medications:  Anti-infectives    Start     Dose/Rate Route Frequency Ordered Stop   02/13/16 1130  piperacillin-tazobactam (ZOSYN) IVPB 3.375 g     3.375 g 100 mL/hr over 30 Minutes Intravenous  Once 02/13/16 1116     02/13/16 1130  vancomycin (VANCOCIN) IVPB 1000 mg/200 mL premix     1,000 mg 200 mL/hr over 60 Minutes Intravenous  Once 02/13/16 1116       Assessment: 39yo presents after having a hypoxic event at a nail salon.  She was found to be hypotensive but responded to IV fluids.  She has been ordered one dose of IV Vanc and Zosyn and we have been asked to follow.  Her last creatinine was normal but the last reported one on file was back in 2009.    Goal of Therapy:  Vancomycin trough level 15-20 mcg/ml  Plan:  - Continue Vancomycin 1gm every 12 hours  - Zosyn 3.375gm every 8 hours to be delivered over 4 hours - f/u Creatinine and adjust doses as needed.  Nadara Mustard, PharmD., MS Clinical Pharmacist Pager:  [phone] Thank you for allowing pharmacy to be part of this  patients care team. 02/13/2016,11:29 AM

## 2016-02-13 NOTE — ED Notes (Signed)
CBG- [id number] 

## 2016-02-13 NOTE — ED Notes (Signed)
Attempted to give report, RN unable to take report at the time.

## 2016-02-13 NOTE — ED Notes (Signed)
Arrives via [facility], was at hair dresser and had projectile vomiting and diarrhea (no recent illness), unable to obtain BP, after of NS, EMS BP 90/P sats 80s, was bagged briefly, arrives on [facility],  HR 130, than 90, CBG 196, responses to voice on arrival

## 2016-02-13 NOTE — Progress Notes (Signed)
Pharmacy Code Sepsis Protocol  Time of code sepsis page: 1246 []  Antibiotics delivered at  [x]  Antibiotics administered prior to code at 1142 and 1235  Were antibiotics ordered at the time of the code sepsis page? Ordered before code was called Was it required to contact the physician? [x]  Physician not contacted []  Physician contacted to order antibiotics for code sepsis []  Physician contacted to recommend changing antibiotics  Pharmacy consulted for: Vancomycin and Zosyn  Anti-infectives    Start     Dose/Rate Route Frequency Ordered Stop   02/14/16 0130  vancomycin (VANCOCIN) IVPB 1000 mg/200 mL premix     1,000 mg 200 mL/hr over 60 Minutes Intravenous Every 12 hours 02/13/16 1319     02/13/16 2000  piperacillin-tazobactam (ZOSYN) IVPB 3.375 g     3.375 g 12.5 mL/hr over 240 Minutes Intravenous 3 times per day 02/13/16 1319     02/13/16 1130  piperacillin-tazobactam (ZOSYN) IVPB 3.375 g     3.375 g 100 mL/hr over 30 Minutes Intravenous  Once 02/13/16 1116 02/13/16 1235   02/13/16 1130  vancomycin (VANCOCIN) IVPB 1000 mg/200 mL premix     1,000 mg 200 mL/hr over 60 Minutes Intravenous  Once 02/13/16 1116          Nurse education provided: []  Minutes left to administer antibiotics to achieve 1 hour goal []  Correct order of antibiotic administration []  Antibiotic Y-site compatibilities     Chany Woolworth D. Laney Potash, PharmD, BCPS Pager:  [phone] 02/13/2016, 1:28 PM

## 2016-02-13 NOTE — ED Notes (Signed)
Pt placed on bair hugger

## 2016-02-13 NOTE — ED Notes (Signed)
Pt unable to void at the time. 

## 2016-02-13 NOTE — ED Provider Notes (Addendum)
CSN: [id number]     Arrival date & time 02/13/16  1054 History   First MD Initiated Contact with Patient 02/13/16 1103     Chief Complaint  Patient presents with  . Altered Mental Status  . Hypotension     (Consider location/radiation/quality/duration/timing/severity/associated sxs/prior Treatment) HPI  Blood pressure 118/63, pulse 94, temperature 92.9 F (33.8 C), temperature source Rectal, resp. rate 22, height 5\' 7"  (1.702 m), weight 86.183 kg, SpO2 99 %.  [name] is a 40 y.o. female with past medical history significant for post-partum CHF, hypertension brought in by [facility] for nausea or vomiting diarrhea, hypotensive, altered mental status. Patient was in her normal state of health, she went to the hair salon was having her hair done and states that she began to feel terrible, she went to the restroom and started vomiting and having diarrhea, EMS was called, they couldn't palpate pulses and states that she was hypoxic down to 70% on room air. Patient was given 1800 mL fluid bolus and she responded well, became more alert. Patient denies headache, cervicalgia, rash, cough, fever, chills, change in urination.  Patient's mother presents to the ED, supplies more history. She was with her daughter at the salon. States that daughter clutched her chest, states that she felt terrible went to the restroom and had clear emesis and non- Melanotic diarrhea. Mother states that while she was sitting on the toilet having a bowel movement she fell off of the toilet, mother states she had a few twitching movements, and was not responsive to voice at that time.  Past Medical History  Diagnosis Date  . 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 LV function recovered EF 60% (6/09)   . Post-infarction apical thrombus (HCC)     resovled  . HTN (hypertension)    History reviewed. No pertinent past surgical history. Family History  Problem Relation Age of  Onset  . Coronary artery disease Other    Social History  Substance Use Topics  . Smoking status: Former Smoker -- 0.50 packs/day for 10 years    Types: Cigarettes    Quit date: 04/19/2006  . Smokeless tobacco: Never Used  . Alcohol Use: No   OB History    No data available     Review of Systems  10 systems reviewed and found to be negative, except as noted in the HPI.   Allergies  Review of patient's allergies indicates no known allergies.  Home Medications   Prior to Admission medications   Medication Sig Start Date End Date Taking? Authorizing Provider  carvedilol (COREG) 6.25 MG tablet Take 1 tablet (6.25 mg total) by mouth 2 (two) times daily. 04/07/14  Yes Swetha D Clegg, NP  cholecalciferol (VITAMIN D) 1000 units tablet Take 1,000 Units by mouth daily.   Yes Historical Provider, MD  furosemide (LASIX) 20 MG tablet Take 1 tablet (20 mg total) by mouth daily. 04/07/14  Yes Syenna D Clegg, NP  lisinopril (PRINIVIL,ZESTRIL) 10 MG tablet Take 1 tablet (10 mg total) by mouth daily. 04/07/14  Yes Sandara D Clegg, NP  Multiple Vitamin (MULTIVITAMIN WITH MINERALS) TABS tablet Take 1 tablet by mouth daily.   Yes Historical Provider, MD  potassium chloride SA (KLOR-CON M20) 20 MEQ tablet Take 1 tablet (20 mEq total) by mouth daily. 04/07/14  Yes Maxx D Clegg, NP  terbinafine (LAMISIL) 250 MG tablet Take 250 mg by mouth daily. 02/03/16 05/03/16 Yes Historical Provider,  MD   BP 115/70 mmHg  Pulse 78  Temp(Src) 98 F (36.7 C) (Oral)  Resp 18  Ht  (1.702 m)  Wt 86.183 kg  BMI 29.75 kg/m2  SpO2 99% Physical Exam  Constitutional: She is oriented to person, place, and time. She appears well-developed and well-nourished. No distress.  Overweight, appears ill, pale, wet hair  HENT:  Head: Normocephalic.  Mouth/Throat: Oropharynx is clear and moist.  Eyes: Conjunctivae and EOM are normal. Pupils are equal, round, and reactive to light.  Cardiovascular: Normal rate, regular rhythm and intact  distal pulses.   Pulmonary/Chest: Effort normal and breath sounds normal. No stridor. No respiratory distress. She has no wheezes. She exhibits no tenderness.  Abdominal: Soft. Bowel sounds are normal. She exhibits no distension. There is tenderness.  Mild TTP in epigastrium with no guarding or rebound   Musculoskeletal: Normal range of motion.  Neurological: She is alert and oriented to person, place, and time.  Psychiatric: She has a normal mood and affect.  Nursing note and vitals reviewed.   ED Course  Procedures (including critical care time)  CRITICAL CARE Performed by: Wynetta Emery   Total critical care time: 35 minutes  Critical care time was exclusive of separately billable procedures and treating other patients.  Critical care was necessary to treat or prevent imminent or life-threatening deterioration.  Critical care was time spent personally by me on the following activities: development of treatment plan with patient and/or surrogate as well as nursing, discussions with consultants, evaluation of patient's response to treatment, examination of patient, obtaining history from patient or surrogate, ordering and performing treatments and interventions, ordering and review of laboratory studies, ordering and review of radiographic studies, pulse oximetry and re-evaluation of patient's condition.  Labs Review Labs Reviewed  COMPREHENSIVE METABOLIC PANEL - Abnormal; Notable for the following:    Potassium 3.3 (*)    CO2 16 (*)    Glucose, Bld 231 (*)    Calcium 8.7 (*)    Total Protein 6.2 (*)    Anion gap 16 (*)    All other components within normal limits  I-STAT BETA HCG BLOOD, ED ([facility], [facility], [facility] ONLY) - Abnormal; Notable for the following:    I-stat hCG, quantitative 8.9 (*)    All other components within normal limits  I-STAT CG4 LACTIC ACID, ED - Abnormal; Notable for the following:    Lactic Acid, Venous 4.66 (*)    All other components within normal limits   CBG MONITORING, ED - Abnormal; Notable for the following:    Glucose-Capillary 266 (*)    All other components within normal limits  CULTURE, BLOOD (ROUTINE X 2)  CULTURE, BLOOD (ROUTINE X 2)  URINE CULTURE  CBC WITH DIFFERENTIAL/PLATELET  LIPASE, BLOOD  HCG, QUANTITATIVE, PREGNANCY  MAGNESIUM  TROPONIN I  URINALYSIS, ROUTINE W REFLEX MICROSCOPIC (NOT AT [facility])  I-STAT CG4 LACTIC ACID, ED    Imaging Review Dg Chest Portable 1 View  02/13/2016  CLINICAL DATA:  Arrives via EMS, was at hair dresser and had projectile vomiting and diarrhea (no recent illness), unable to obtain BP, sats 80s. Pt has AMS. Hx HTN, CHF EXAM: PORTABLE CHEST 1 VIEW COMPARISON:  12/23/2007 FINDINGS: The heart size and mediastinal contours are within normal limits. Both lungs are clear. No pleural effusion or pneumothorax. The visualized skeletal structures are unremarkable. IMPRESSION: No active disease. Electronically Signed   By: Amie Portland M.D.   On: 02/13/2016 11:39   I have personally reviewed and  evaluated these images and lab results as part of my medical decision-making.   EKG Interpretation   Date/Time:  Saturday February 13 2016 10:59:42 EDT Ventricular Rate:  97 PR Interval:  186 QRS Duration: 115 QT Interval:  389 QTC Calculation: 494 R Axis:   96 Text Interpretation:  Sinus rhythm Nonspecific intraventricular conduction  delay Low voltage, precordial leads nonspecific t wave changes compared to  prior ECG Confirmed by KNAPP  MD-J, JON ([id number]) on 02/13/2016 11:05:08 AM      MDM   Final diagnoses:  Syncope and collapse  Hypothermia, initial encounter    Filed Vitals:   02/13/16 1137 02/13/16 1226 02/13/16 1331 02/13/16 1446  BP: 123/49 110/56 115/70 108/77  Pulse: 93 96 78 77  Temp:   98 F (36.7 C) 98.2 F (36.8 C)  TempSrc:   Oral Oral  Resp: 18 26 18 12   Height:      Weight:      SpO2: 99% 100% 99% 99%    Medications  vancomycin (VANCOCIN) IVPB 1000 mg/200 mL premix (not  administered)  piperacillin-tazobactam (ZOSYN) IVPB 3.375 g (not administered)  potassium chloride 10 mEq in 100 mL IVPB (not administered)  piperacillin-tazobactam (ZOSYN) IVPB 3.375 g (0 g Intravenous Stopped 02/13/16 1235)  vancomycin (VANCOCIN) IVPB 1000 mg/200 mL premix (0 mg Intravenous Stopped 02/13/16 1354)  magnesium sulfate IVPB 2 g 50 mL (2 g Intravenous New Bag/Given 02/13/16 1403)    [name] is 40 y.o. female presenting with Nausea vomiting diarrhea and altered mental status, patient was initially hypotensive, patient with history of postpartum CHF, managed by [facility], was given 1800 mL of fluid an route by EMS with excellent return of mentation. Ekg with non specific T wave changes, no arrhythmia.   Patient oriented 3, not confused. As per mother patient was going in and out of consciousness while on the floor of the bathroom at the salon that she was in. Doesn't appear there was true tonic-clonic movement consistent with seizure and patient had a seizure in the past.  Lactic acid is elevated at 4.6, considering her history of CHF and that she has already been given 2 liters of IVF and BP is strong will hold on more fluids for now.   Repeat abdominal exam benign.   EKG with no ischemic changes, potassium is mildly depleted at 3.3. Will check mag and also replete.  Case discussed with [facility] NP Gunnar Fusi who accepts admission with Dr. Katrinka Blazing.   Wynetta Emery, PA-C 02/13/16 1432  [name] Sabas, PA-C 02/13/16 1501  Wynetta Emery, PA-C 02/13/16 1505  Dinnis Rog, PA-C 02/13/16 1506  Linwood Dibbles, MD 02/13/16 1552  Wynetta Emery, PA-C 02/14/16 1128  Linwood Dibbles, MD 02/15/16 [phone]

## 2016-02-13 NOTE — ED Notes (Signed)
Admitting MD at bedside.

## 2016-02-13 NOTE — H&P (Addendum)
Attending MD note  Patient was seen, examined,treatment plan was discussed with the  Advance Practice Provider.  I have personally reviewed the clinical findings, lab,EKG, imaging studies and management of this patient in detail.I have also reviewed the orders written for this patient which were under my direction. I agree with the documentation, as recorded by the Advance Practice Provider.   [name] is a 40 y.o. female with a past medical history significant for postpartum cardiomyopathy last ejection fraction noted to be 55-60% in June 2003; who presented after feeling lightheadedness with nausea and vomiting symptoms and subsequent syncope episode. Initially found to be hypotensive upon EMS arrival with systolic blood pressures in the 90s. On arrival patient found to have a lactic acid of 4.53, with an anion gap of 16, and hypothermic giving rise to the question of sepsis. Patient was empirically started on vancomycin and Zosyn. Overall patient improved after IV fluids. Will monitor overnight to rule out the possibility of cardiac cause of patient's symptoms. Suspect that the syncopal episode could have been secondary to vasovagal response. Will need to follow-up cultures, trend cardiac troponins, check echocardiogram, and investigated if patient needs continuation of antibiotics for sepsis versus possible further evaluation by cardiology.    Clydie Braun, MD Internal Medicine  pager [phone] [facility]   [facility] History and Physical  [name] UYQ:[id number] DOB: 10-05-1976 DOA: 02/13/2016  Referring physician: Emergency Department PCP: Pcp Not In System   CHIEF COMPLAINT:   Syncopal episode                HPI: [name] is a 40 y.o. 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 Echo in June 2013 showed she had recovered with an EF of 55-60%. Followed by Dr. Gala Romney, doing well at time of last visit June 2015.  Remains of daily meds for heart failure.  Patient brought by EMS for evaluation syncopal episode. Patient was at the salon getting hair bleached around 10am (like she has done numerous times) when she developed lightheadedness. Felt almost like her blood sugar was low. Patient told hairdresser she needs something to eat. Patient was given a fruit snack but after attempting to eat 1 patient suddenly felt she had to urinate or defecate, she wasn't sure which one . Patient went into the restroom and began vomiting bilious material. She does not remember much beyond that . Her mother was called to the salon . Mother tells me that patient was found in the bathroom slumped over the toilet seat mumbling . He was vomiting the trash can, a large amount of soft stool in the toilet and supple urine on the floor. A few minutes later patient lost consciousness, her eyes rolled back in her head . EMS was called to the scene and when they arrived BP was 90/P. She was bagged briefly for sats in 80s.Given 2 liters of NS. Patient had not taken any of her cardiac meds this am. She takes no herbal products. She had felt completely fine prior to these acute symptoms this morning no known sick contacts. No recent fevers or chills.   ED workup:  Rectal temp 92.9, up to 98.2 now.  Istat hCg quant 8.9 but HCG beta chain quant is negative - has an IUD Blood Cx pending Lactic acid >5, Temp 92 rectally. CXR- no active disease  Code sepsis called - started on Vanco /zosyn IV Mg+ and K+ given  EKG:  Sinus rhythm Nonspecific intraventricular conduction delay Low voltage, precordial leads nonspecific t wave changes compared to prior ECG Confirmed by KNAPP MD-J, JON ([id number]) on 02/13/2016 11:05:08 AM                   Medications  vancomycin (VANCOCIN) IVPB 1000 mg/200 mL premix (not administered)  piperacillin-tazobactam (ZOSYN) IVPB 3.375 g (not administered)  potassium chloride 10 mEq in 100 mL IVPB (not administered)    piperacillin-tazobactam (ZOSYN) IVPB 3.375 g (0 g Intravenous Stopped 02/13/16 1235)  vancomycin (VANCOCIN) IVPB 1000 mg/200 mL premix (0 mg Intravenous Stopped 02/13/16 1354)  magnesium sulfate IVPB 2 g 50 mL (2 g Intravenous New Bag/Given 02/13/16 1403)    Review of Systems  Constitutional: Negative.   HENT: Negative.   Eyes: Negative.   Respiratory: Negative.   Cardiovascular: Negative.   Gastrointestinal: Positive for nausea, vomiting and diarrhea.  Genitourinary: Negative.   Musculoskeletal: Negative.   Skin: Negative.   Neurological: Positive for loss of consciousness.  Endo/Heme/Allergies: Negative.   Psychiatric/Behavioral: Negative.     Past Medical History  Diagnosis Date  . 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 LV function recovered EF 60% (6/09)   . Post-infarction apical thrombus (HCC)     resovled  . HTN (hypertension)    History reviewed. No pertinent past surgical history.  SOCIAL HISTORY:  reports that she quit smoking about 9 years ago. Her smoking use included Cigarettes. She has a 5 pack-year smoking history. She has never used smokeless tobacco. She reports that she does not drink alcohol or use illicit drugs. Lives:  At home with family    Assistive devices:   None needed for ambulation.   No Known Allergies  Family History  Problem Relation Age of Onset  . Coronary artery disease Other     Prior to Admission medications   Medication Sig Start Date End Date Taking? Authorizing Provider  carvedilol (COREG) 6.25 MG tablet Take 1 tablet (6.25 mg total) by mouth 2 (two) times daily. 04/07/14  Yes Kayloni D Clegg, NP  cholecalciferol (VITAMIN D) 1000 units tablet Take 1,000 Units by mouth daily.   Yes Historical Provider, MD  furosemide (LASIX) 20 MG tablet Take 1 tablet (20 mg total) by mouth daily. 04/07/14  Yes Loreena D Clegg, NP  lisinopril (PRINIVIL,ZESTRIL) 10 MG tablet Take 1 tablet (10 mg total) by mouth daily. 04/07/14   Yes Isel D Clegg, NP  Multiple Vitamin (MULTIVITAMIN WITH MINERALS) TABS tablet Take 1 tablet by mouth daily.   Yes Historical Provider, MD  potassium chloride SA (KLOR-CON M20) 20 MEQ tablet Take 1 tablet (20 mEq total) by mouth daily. 04/07/14  Yes Cing D Clegg, NP  terbinafine (LAMISIL) 250 MG tablet Take 250 mg by mouth daily. 02/03/16 05/03/16 Yes Historical Provider, MD   PHYSICAL EXAM: Filed Vitals:   02/13/16 1130 02/13/16 1137 02/13/16 1226 02/13/16 1331  BP:  123/49 110/56 115/70  Pulse:  93 96 78  Temp:    98 F (36.7 C)  TempSrc:    Oral  Resp:  Height:      Weight:      SpO2: 99% 99% 100% 99%    Wt Readings from Last 3 Encounters:  02/13/16 86.183 kg (190 lb)  04/07/14 95.482 kg (210 lb 8 oz)  05/30/12 98.612 kg (217 lb 6.4 oz)    General:  Pleasant white  female. Appears calm and  comfortable Eyes: PER, normal lids, irises & conjunctiva ENT: grossly normal hearing, lips & tongue Neck: no LAD, no masses Cardiovascular: RRR, no murmurs. No LE edema.  Respiratory: Respirations even and unlabored. Normal respiratory effort. Lungs CTA bilaterally, no wheezes / rales .   Abdomen: soft, non-distended, non-tender, active bowel sounds. No obvious masses.  Skin: no rash seen on limited exam Musculoskeletal: grossly normal tone BUE/BLE Psychiatric: grossly normal mood and affect, speech fluent and appropriate Neurologic: grossly non-focal.         LABS ON ADMISSION:    Basic Metabolic Panel:  Recent Labs Lab 02/13/16 1213 02/13/16 1355  NA 141  --   K 3.3*  --   CL 109  --   CO2 16*  --   GLUCOSE 231*  --   BUN 7  --   CREATININE 1.00  --   CALCIUM 8.7*  --   MG  --  1.7   Liver Function Tests:  Recent Labs Lab 02/13/16 1213  AST 24  ALT 16  ALKPHOS 54  BILITOT 0.6  PROT 6.2*  ALBUMIN 3.7    CBC:  Recent Labs Lab 02/13/16 1213  WBC 9.3  NEUTROABS 6.8  HGB 13.0  HCT 38.3  MCV 88.7  PLT 273    CREATININE: 1 (02/13/16  1213) Estimated creatinine clearance - 83.4 mL/min  Radiological Exams on Admission: Dg Chest Portable 1 View  02/13/2016  CLINICAL DATA:  Arrives via EMS, was at [employer] and had projectile vomiting and diarrhea (no recent illness), unable to obtain BP, sats 80s. Pt has AMS. Hx HTN, CHF EXAM: PORTABLE CHEST 1 VIEW COMPARISON:  12/23/2007 FINDINGS: The heart size and mediastinal contours are within normal limits. Both lungs are clear. No pleural effusion or pneumothorax. The visualized skeletal structures are unremarkable. IMPRESSION: No active disease. Electronically Signed   By: Amie Portland M.D.   On: 02/13/2016 11:39    ASSESSMENT / PLAN   Active Problems:   Syncope Nausea, vomiting, syncope. Presented with hypotenson, hypothermia, lactic acid of 4.6. C02 16, Anion Gap 16. Code Sepsis called by ED. Patient feels fine now, hemodynamically stable. While sepsis not ruled out, patient may have had a vasovagal reaction (symptoms started while getting hair bleached). -admit to Observation - Telemetry  -S/p 2 liters of IVF in the field. Start maintenance fluids at 152ml/hr -blood cultures pending -trend lactic acid -procalcitonin -Vancomycin / Zosyn started -Held home diuretics until am -Temp normal now. Patient eating and drinking, visiting with family. Feels completely back     to normal  Hypokalemia. Given IV in ED. - will add KDur this evening and check am bmet.    Hyperglycemia. No history of diabetes. Glucose 231 but following coffee with creamer.  -monitor CBGs Q4 x 24 hours -A1c  Hx of pregnancy induced cardiomyopathy in 2008 She made a full recovery based on echo in 2013. Remains on beta blocker, ACEI, and lasix.  - doubt symptoms of cardiac origin but will trend troponins, initial one negative.  - Check echocardiogram in a.m.  CONSULTANTS:    None  Code Status: DNR DVT Prophylaxis: Lovenox Family Communication:  Patient alert, oriented and understands plan  of care.  Disposition Plan: Discharge to home in 24-48 hours   Time spent: 60 minutes [name] Cluster  NP [facility] Pager [phone]

## 2016-02-13 NOTE — Plan of Care (Signed)
Problem: Safety: Goal: Ability to remain free from injury will improve Outcome: Completed/Met Date Met:  02/13/16 Pt educated on safety measures put into place. Pt verbalized understanding.      

## 2016-02-14 ENCOUNTER — Observation Stay (HOSPITAL_BASED_OUTPATIENT_CLINIC_OR_DEPARTMENT_OTHER): Payer: Managed Care, Other (non HMO)

## 2016-02-14 DIAGNOSIS — T68XXXA Hypothermia, initial encounter: Secondary | ICD-10-CM

## 2016-02-14 DIAGNOSIS — R55 Syncope and collapse: Secondary | ICD-10-CM | POA: Diagnosis not present

## 2016-02-14 DIAGNOSIS — R739 Hyperglycemia, unspecified: Secondary | ICD-10-CM | POA: Diagnosis not present

## 2016-02-14 LAB — ECHOCARDIOGRAM COMPLETE
Height: 68 in
Weight: 3224.01 [oz_av]

## 2016-02-14 NOTE — Plan of Care (Signed)
Problem: Education: Goal: Knowledge of [facility] Education information/materials will improve Outcome: Completed/Met Date Met:  02/14/16 Pt educated on safety measures, pain scale, and current/new medications. Pt verbalized understanding. Plan of care reviewed with pt and family.

## 2016-02-14 NOTE — Progress Notes (Signed)
  Echocardiogram 2D Echocardiogram has been performed.  Leta Jungling M 02/14/2016, 8:40 AM

## 2016-02-14 NOTE — Discharge Instructions (Signed)

## 2016-02-14 NOTE — Plan of Care (Signed)
Problem: Health Behavior/Discharge Planning: Goal: Ability to manage health-related needs will improve Outcome: Completed/Met Date Met:  02/14/16 Encourage medication compliance and overall treatment plan.

## 2016-02-14 NOTE — Discharge Summary (Signed)
Physician Discharge Summary  [name] [name] DOB: 07/14/1976 DOA: 02/13/2016  PCP: Pcp Not In System  Admit date: 02/13/2016 Discharge date: 02/14/2016  Recommendations for Outpatient Follow-up:  1. Pt will need to follow up with PCP in 2-3 weeks post discharge  Discharge Diagnoses:  Principal Problem:   Syncope Active Problems:   Hyperglycemia   Postpartum cardiomyopathy   Hypokalemia   Hypothermia  Discharge Condition: Stable  Diet recommendation: Heart healthy diet discussed in details   History of present illness:  40 y.o. 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 Echo in June 2013 showed she had recovered with an EF of 55-60%. Followed by Dr. Gala [name], doing well at time of last visit June 2015. Remains of daily meds for heart failure. Presented for evaluation of near syncopal event. Pt reports she was getting her hair done at the saloon and suddenly felt sik to her stomach and felt like she was going to pass out. She explains she was very stressed out for the past week and had a lot on her mind, was not eating or drinking much fluids.   Hospital Course:  Principal Problem:   Near Syncope - ? Vasovagal and provoked by stressors - pt now feels better, ambulating with no assistance and insists on going home today - ECHO unremarkable, reviewed with pt and she has appreciated the time   Active Problems:   Hypokalemia - supplemented prior to discharge     Hypothermia - resolved     Obesity  - Body mass index is 30.65 kg/(m^2).    Procedures/Studies: Dg Chest Portable 1 View  02/13/2016  CLINICAL DATA:  Arrives via EMS, was at hair dresser and had projectile vomiting and diarrhea (no recent illness), unable to obtain BP, sats 80s. Pt has AMS. Hx HTN, CHF EXAM: PORTABLE CHEST 1 VIEW COMPARISON:  12/23/2007 FINDINGS: The heart size and mediastinal contours are within normal limits. Both lungs are clear. No pleural effusion  or pneumothorax. The visualized skeletal structures are unremarkable. IMPRESSION: No active disease. Electronically Signed   By: [name] M.D.   On: 02/13/2016 11:39   Discharge Exam: Filed Vitals:   02/13/16 2340 02/14/16 0350  BP: 109/62 119/49  Pulse:  68  Temp: 98.5 F (36.9 C) 98.4 F (36.9 C)  Resp: 18 18   Filed Vitals:   02/13/16 1617 02/13/16 2015 02/13/16 2340 02/14/16 0350  BP: 108/53 119/52 109/62 119/49  Pulse:  77  68  Temp: 98.6 F (37 C) 98.4 F (36.9 C) 98.5 F (36.9 C) 98.4 F (36.9 C)  TempSrc: Oral Oral Oral Oral  Resp: 14 16 18 18   Height: 5\' 8"  (1.727 m)     Weight: 90.719 kg (200 lb)   91.4 kg (201 lb 8 oz)  SpO2: 99% 99% 99% 97%    General: Pt is alert, follows commands appropriately, not in acute distress Cardiovascular: Regular rate and rhythm, no rubs, no gallops Respiratory: Clear to auscultation bilaterally, no wheezing, no crackles, no rhonchi Abdominal: Soft, non tender, non distended, bowel sounds +, no guarding Extremities: no edema, no cyanosis, pulses palpable bilaterally DP and PT Neuro: Grossly nonfocal  Discharge Instructions  Discharge Instructions    Diet - low sodium heart healthy    Complete by:  As directed      Increase activity slowly    Complete by:  As directed             Medication List  TAKE these medications        carvedilol 6.25 MG tablet  Commonly known as:  COREG  Take 1 tablet (6.25 mg total) by mouth 2 (two) times daily.     cholecalciferol 1000 units tablet  Commonly known as:  VITAMIN D  Take 1,000 Units by mouth daily.     furosemide 20 MG tablet  Commonly known as:  LASIX  Take 1 tablet (20 mg total) by mouth daily.     LAMISIL 250 MG tablet  Generic drug:  terbinafine  Take 250 mg by mouth daily.     lisinopril 10 MG tablet  Commonly known as:  PRINIVIL,ZESTRIL  Take 1 tablet (10 mg total) by mouth daily.     multivitamin with minerals Tabs tablet  Take 1 tablet by mouth daily.      potassium chloride SA 20 MEQ tablet  Commonly known as:  KLOR-CON M20  Take 1 tablet (20 mEq total) by mouth daily.          The results of significant diagnostics from this hospitalization (including imaging, microbiology, ancillary and laboratory) are listed below for reference.     Microbiology: No results found for this or any previous visit (from the past 240 hour(s)).   Labs: Basic Metabolic Panel:  Recent Labs Lab 02/13/16 1213 02/13/16 1355  NA 141  --   K 3.3*  --   CL 109  --   CO2 16*  --   GLUCOSE 231*  --   BUN 7  --   CREATININE 1.00  --   CALCIUM 8.7*  --   MG  --  1.7   Liver Function Tests:  Recent Labs Lab 02/13/16 1213  AST 24  ALT 16  ALKPHOS 54  BILITOT 0.6  PROT 6.2*  ALBUMIN 3.7    Recent Labs Lab 02/13/16 1358  LIPASE 32   No results for input(s): AMMONIA in the last 168 hours. CBC:  Recent Labs Lab 02/13/16 1213  WBC 9.3  NEUTROABS 6.8  HGB 13.0  HCT 38.3  MCV 88.7  PLT 273   Cardiac Enzymes:  Recent Labs Lab 02/13/16 1355 02/13/16 1704 02/13/16 2151  TROPONINI <0.03 0.04* 0.03   CBG:  Recent Labs Lab 02/13/16 1117  GLUCAP 266*     SIGNED: Time coordinating discharge: 30 minutes  MAGICK-Ladaija Dimino, MD  [facility] 02/14/2016, 9:10 AM Pager [phone]  If 7PM-7AM, please contact night-coverage [email or web address] Password [email or web address]

## 2016-02-15 LAB — HEMOGLOBIN A1C
Hgb A1c MFr Bld: 6.9 % — ABNORMAL HIGH (ref 4.8–5.6)
MEAN PLASMA GLUCOSE: 151 mg/dL

## 2016-02-18 LAB — CULTURE, BLOOD (ROUTINE X 2)
CULTURE: NO GROWTH
CULTURE: NO GROWTH

## 2016-04-27 ENCOUNTER — Emergency Department (HOSPITAL_COMMUNITY)
Admission: EM | Admit: 2016-04-27 | Discharge: 2016-04-27 | Discharge status: Against Medical Advice | Payer: Managed Care, Other (non HMO) | Attending: Emergency Medicine | Admitting: Emergency Medicine

## 2016-04-27 ENCOUNTER — Emergency Department (HOSPITAL_COMMUNITY): Payer: Managed Care, Other (non HMO)

## 2016-04-27 ENCOUNTER — Other Ambulatory Visit: Payer: Self-pay

## 2016-04-27 ENCOUNTER — Encounter (HOSPITAL_COMMUNITY): Payer: Self-pay | Admitting: Family Medicine

## 2016-04-27 DIAGNOSIS — Z79899 Other long term (current) drug therapy: Secondary | ICD-10-CM | POA: Diagnosis not present

## 2016-04-27 DIAGNOSIS — Z7984 Long term (current) use of oral hypoglycemic drugs: Secondary | ICD-10-CM | POA: Insufficient documentation

## 2016-04-27 DIAGNOSIS — R55 Syncope and collapse: Secondary | ICD-10-CM | POA: Diagnosis not present

## 2016-04-27 DIAGNOSIS — Z87891 Personal history of nicotine dependence: Secondary | ICD-10-CM | POA: Insufficient documentation

## 2016-04-27 DIAGNOSIS — R739 Hyperglycemia, unspecified: Secondary | ICD-10-CM | POA: Insufficient documentation

## 2016-04-27 DIAGNOSIS — I509 Heart failure, unspecified: Secondary | ICD-10-CM | POA: Diagnosis not present

## 2016-04-27 DIAGNOSIS — I110 Hypertensive heart disease with heart failure: Secondary | ICD-10-CM | POA: Insufficient documentation

## 2016-04-27 HISTORY — DX: Unspecified ovarian cyst, unspecified side: N83.209

## 2016-04-27 LAB — COMPREHENSIVE METABOLIC PANEL
ALBUMIN: 3.6 g/dL (ref 3.5–5.0)
ALK PHOS: 50 U/L (ref 38–126)
ALT: 18 U/L (ref 14–54)
ANION GAP: 15 (ref 5–15)
AST: 27 U/L (ref 15–41)
BILIRUBIN TOTAL: 0.6 mg/dL (ref 0.3–1.2)
BUN: 8 mg/dL (ref 6–20)
CALCIUM: 8.7 mg/dL — AB (ref 8.9–10.3)
CO2: 17 mmol/L — ABNORMAL LOW (ref 22–32)
Chloride: 106 mmol/L (ref 101–111)
Creatinine, Ser: 1.05 mg/dL — ABNORMAL HIGH (ref 0.44–1.00)
GFR calc Af Amer: >60 mL/min (ref >60)
GLUCOSE: 246 mg/dL — AB (ref 65–99)
POTASSIUM: 3.6 mmol/L (ref 3.5–5.1)
Sodium: 138 mmol/L (ref 135–145)
TOTAL PROTEIN: 6.4 g/dL — AB (ref 6.5–8.1)

## 2016-04-27 LAB — URINALYSIS, ROUTINE W REFLEX MICROSCOPIC
BILIRUBIN URINE: NEGATIVE
GLUCOSE, UA: 100 mg/dL — AB
HGB URINE DIPSTICK: NEGATIVE
KETONES UR: 15 mg/dL — AB
Leukocytes, UA: NEGATIVE
Nitrite: NEGATIVE
PROTEIN: 100 mg/dL — AB
Specific Gravity, Urine: 1.018 (ref 1.005–1.030)
pH: 7 (ref 5.0–8.0)

## 2016-04-27 LAB — CBC WITH DIFFERENTIAL/PLATELET
BASOS PCT: 0 %
Basophils Absolute: 0 10*3/uL (ref 0.0–0.1)
EOS ABS: 0.1 10*3/uL (ref 0.0–0.7)
Eosinophils Relative: 1 %
HEMATOCRIT: 35.6 % — AB (ref 36.0–46.0)
HEMOGLOBIN: 12 g/dL (ref 12.0–15.0)
Lymphocytes Relative: 22 %
Lymphs Abs: 2.5 10*3/uL (ref 0.7–4.0)
MCH: 29.9 pg (ref 26.0–34.0)
MCHC: 33.7 g/dL (ref 30.0–36.0)
MCV: 88.8 fL (ref 78.0–100.0)
MONO ABS: 0.3 10*3/uL (ref 0.1–1.0)
Monocytes Relative: 3 %
Neutro Abs: 8.5 10*3/uL — ABNORMAL HIGH (ref 1.7–7.7)
Neutrophils Relative %: 74 %
Platelets: 323 10*3/uL (ref 150–400)
RBC: 4.01 MIL/uL (ref 3.87–5.11)
RDW: 12.8 % (ref 11.5–15.5)
WBC: 11.4 10*3/uL — ABNORMAL HIGH (ref 4.0–10.5)

## 2016-04-27 LAB — URINE MICROSCOPIC-ADD ON

## 2016-04-27 LAB — I-STAT TROPONIN, ED: TROPONIN I, POC: 0 ng/mL (ref 0.00–0.08)

## 2016-04-27 LAB — CBG MONITORING, ED: Glucose-Capillary: 246 mg/dL — ABNORMAL HIGH (ref 65–99)

## 2016-04-27 LAB — I-STAT BETA HCG BLOOD, ED (MC, WL, AP ONLY): I-stat hCG, quantitative: <5 m[IU]/mL (ref <5)

## 2016-04-27 MED ORDER — SODIUM CHLORIDE 0.9 % IV BOLUS (SEPSIS)
1000.0000 mL | Freq: Once | INTRAVENOUS | Status: AC
  Administered 2016-04-27: 1000 mL via INTRAVENOUS

## 2016-04-27 NOTE — ED Notes (Signed)
Pt presents via [facility] with c/o LOC and possible seizure activity. Pt was at hair salon getting her hair bleached when episode occurred (prior episode also at hair salon 02/06/16).  EMS was unable to obtain a BP, and no radial pulse until just prior to arrival.  Patient is groggy but oriented x4. Husband and mother at bedside and are excellent historian.

## 2016-04-27 NOTE — ED Notes (Signed)
Pt. Ambulated in hallway, tolerated well. No complaints.

## 2016-04-27 NOTE — ED Notes (Signed)
Pt CBG, 246. Nurse was notified.

## 2016-04-27 NOTE — ED Provider Notes (Signed)
CSN: [id number]     Arrival date & time 04/27/16  1200 History   First MD Initiated Contact with Patient 04/27/16 1213     Chief Complaint  Patient presents with  . Loss of Consciousness     (Consider location/radiation/quality/duration/timing/severity/associated sxs/prior Treatment) HPI [name] is a 40 y.o. female history of CHF on Lasix therapy, hypertension, here for evaluation of loss of consciousness. Patient is accompanied by female bedside reports similar situation occurred on 4/1. Therefore patient was at a salon, as she was today, having her hair dyed when she suddenly felt warm, became nauseous and lost consciousness momentarily. Patient was a salon today at roughly 10:30 AM, having her hair dyed when she again felt warm, nauseous and lost consciousness momentarily. She is now becoming more alert and is oriented 4. Family reports last time this occurred, she lost control of her bowels, no loss of bowel or bladder during the current event. She denies any associated fevers, chills, chest pain, shortness of breath, usual leg swelling, hemoptysis, urinary symptoms, diarrhea or constipation. She does report mild diffuse abdominal discomfort. She reports she also became nauseous this morning and threw up her metformin. Nothing makes this problem better or worse. No other modifying factors.  Past Medical History  Diagnosis Date  . 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 LV function recovered EF 60% (6/09)   . Post-infarction apical thrombus (HCC)     resovled  . HTN (hypertension)   . Ovarian cyst rupture    Past Surgical History  Procedure Laterality Date  . Cesarean section    . Tonsillectomy     Family History  Problem Relation Age of Onset  . Coronary artery disease Other    Social History  Substance Use Topics  . Smoking status: Former Smoker -- 0.50 packs/day for 10 years    Types: Cigarettes    Quit date: 04/19/2006  . Smokeless  tobacco: Never Used  . Alcohol Use: No   OB History    Gravida Para Term Preterm AB TAB SAB Ectopic Multiple Living   3 3             Review of Systems A 10 point review of systems was completed and was negative except for pertinent positives and negatives as mentioned in the history of present illness     Allergies  Review of patient's allergies indicates no known allergies.  Home Medications   Prior to Admission medications   Medication Sig Start Date End Date Taking? Authorizing Provider  carvedilol (COREG) 6.25 MG tablet Take 1 tablet (6.25 mg total) by mouth 2 (two) times daily. 04/07/14  Yes Sharry D Clegg, NP  cholecalciferol (VITAMIN D) 1000 units tablet Take 1,000 Units by mouth daily.   Yes Historical Provider, MD  furosemide (LASIX) 20 MG tablet Take 1 tablet (20 mg total) by mouth daily. 04/07/14  Yes Masyn D Clegg, NP  lisinopril (PRINIVIL,ZESTRIL) 10 MG tablet Take 1 tablet (10 mg total) by mouth daily. 04/07/14  Yes Hansika D Clegg, NP  metFORMIN (GLUCOPHAGE-XR) 500 MG 24 hr tablet Take 500 mg by mouth 2 (two) times daily. 03/28/16  Yes Historical Provider, MD  Multiple Vitamin (MULTIVITAMIN WITH MINERALS) TABS tablet Take 1 tablet by mouth daily.   Yes Historical Provider, MD  potassium chloride SA (KLOR-CON M20) 20 MEQ tablet Take 1 tablet (20 mEq total) by mouth daily. 04/07/14  Yes Tatumn Georgie Chard, NP  terbinafine (LAMISIL)  250 MG tablet Take 250 mg by mouth daily. 02/03/16 05/03/16 Yes Historical Provider, MD   BP 110/57 mmHg  Pulse 85  Temp(Src) 98.1 F (36.7 C) (Oral)  Resp 15  Ht  (1.702 m)  SpO2 95% Physical Exam  Constitutional: She is oriented to person, place, and time. She appears well-developed and well-nourished.  HENT:  Head: Normocephalic and atraumatic.  Mouth/Throat: Oropharynx is clear and moist. No oropharyngeal exudate.  No intraoral trauma  Eyes: Conjunctivae are normal. Pupils are equal, round, and reactive to light. Right eye exhibits no discharge. Left  eye exhibits no discharge. No scleral icterus.  Neck: Neck supple.  Cardiovascular: Normal rate, regular rhythm and normal heart sounds.   Pulmonary/Chest: Effort normal and breath sounds normal. No respiratory distress. She has no wheezes. She has no rales.  Abdominal: Soft.  Mild diffuse lower abdominal tenderness with palpation. Abdomen is otherwise soft, nondistended without rebound or guarding.  Musculoskeletal: She exhibits no tenderness.  Neurological: She is alert and oriented to person, place, and time.  Cranial Nerves II-XII grossly intact  Skin: Skin is warm and dry. No rash noted.  Psychiatric: She has a normal mood and affect.  Nursing note and vitals reviewed.   ED Course  Procedures (including critical care time) Labs Review Labs Reviewed  CBC WITH DIFFERENTIAL/PLATELET - Abnormal; Notable for the following:    WBC 11.4 (*)    HCT 35.6 (*)    Neutro Abs 8.5 (*)    All other components within normal limits  COMPREHENSIVE METABOLIC PANEL - Abnormal; Notable for the following:    CO2 17 (*)    Glucose, Bld 246 (*)    Creatinine, Ser 1.05 (*)    Calcium 8.7 (*)    Total Protein 6.4 (*)    All other components within normal limits  URINALYSIS, ROUTINE W REFLEX MICROSCOPIC (NOT AT [facility]) - Abnormal; Notable for the following:    APPearance CLOUDY (*)    Glucose, UA 100 (*)    Ketones, ur 15 (*)    Protein, ur 100 (*)    All other components within normal limits  URINE MICROSCOPIC-ADD ON - Abnormal; Notable for the following:    Squamous Epithelial / LPF 6-30 (*)    Bacteria, UA MANY (*)    All other components within normal limits  CBG MONITORING, ED - Abnormal; Notable for the following:    Glucose-Capillary 246 (*)    All other components within normal limits  CBG MONITORING, ED  I-STAT BETA HCG BLOOD, ED ([facility], [facility], [facility] ONLY)  I-STAT TROPOININ, ED    Imaging Review Dg Chest 2 View  04/27/2016  CLINICAL DATA:  Loss of consciousness today EXAM: CHEST  2 VIEW  COMPARISON:  02/13/2016 FINDINGS: Normal heart size. Lungs clear. No pneumothorax. No pleural effusion. IMPRESSION: No active cardiopulmonary disease. Electronically Signed   By: Jolaine Click M.D.   On: 04/27/2016 13:21   Ct Head Wo Contrast  04/27/2016  CLINICAL DATA:  Syncope EXAM: CT HEAD WITHOUT CONTRAST TECHNIQUE: Contiguous axial images were obtained from the base of the skull through the vertex without intravenous contrast. COMPARISON:  None. FINDINGS: Brain parenchyma, ventricular system, and extra-axial space are within normal limits. No mass effect, midline shift, or acute hemorrhage. Cranium is intact. There is fluid in the right mastoid air cells. IMPRESSION: Fluid in the right mastoid air cells. Otherwise, no acute intracranial pathology. Electronically Signed   By: Jolaine Click M.D.   On: 04/27/2016 13:25  I have personally reviewed and evaluated these images and lab results as part of my medical decision-making.   EKG Interpretation   Date/Time:  Wednesday April 27 2016 12:02:37 EDT Ventricular Rate:  90 PR Interval:    QRS Duration: 98 QT Interval:  394 QTC Calculation: 483 R Axis:   80 Text Interpretation:  Sinus rhythm Normal ECG Confirmed by Gerhard Munch  MD ([id number]) on 04/27/2016 1:37:06 PM     Filed Vitals:   04/27/16 1445 04/27/16 1500 04/27/16 1515 04/27/16 1610  BP: 106/67 110/64 110/57   Pulse: 81 86 85   Temp:    98.1 F (36.7 C)  TempSrc:    Oral  Resp: 16 18 15    Height:      SpO2: 100% 98% 95%     MDM  Patient presents for evaluation of loss of consciousness. On initial evaluation, vital signs have normalized, heart rate 90s, blood pressure 110s systolic, she is alert and oriented 4, GCS 15. She has a nonfocal neuro exam, mild lower abdominal tenderness. Mother bedside reports patient has been under "a lot of stress" the patient may not be eating and drinking as much as she should. Plan to CT head, screening labs. Currently receiving IV  fluids.  Upon reevaluation, patient reports she feels much better. Screening labs are essentially unremarkable, CT head no acute intracranial abnormality, chest x-ray is unremarkable. EKG shows normal sinus rhythm and troponin is negative. However, I believe she would benefit from overnight observation for further evaluation of her syncope as it does sound concerning for possible seizure activity. Discussed plan with patient and she declines admission. I discussed this would require her signing out against medical advice, she verbalizes understanding and agrees with this plan and agrees to assume the risks associated with this, including worsening of symptoms and death. I believe she is of sound mind to make this decision. Husband and mother at bedside both agree with patient's plan for leaving AGAINST MEDICAL ADVICE but do agree to return for any worsening symptoms. Also discussed driving precautions and other seizure precautions with patient and that she would need to be evaluated by neurology before resuming these activities, she agrees. She also agrees to follow-up with her cardiologist for further evaluation of syncopal event. Discussed with my attending, Dr. Jeraldine Loots Final diagnoses:  Syncope and collapse  Hyperglycemia       Joycie Peek, PA-C 04/28/16 1553  Gerhard Munch, MD 04/28/16 1810

## 2016-04-27 NOTE — ED Notes (Signed)
Placed pt on bed pan.

## 2016-04-27 NOTE — ED Notes (Signed)
Ben PA at bedside. 

## 2016-04-27 NOTE — Discharge Instructions (Signed)
You have decided to sign out AGAINST MEDICAL ADVICE. Please follow-up with your cardiologist and neurology for further evaluation of her syncopal event. Please return to ED for any new or worsening symptoms as we discussed.

## 2017-05-12 IMAGING — CR DG CHEST 1V PORT
1 series · 1 of 1 positions shown · non-contrast
Comparison: 12/23/2007

CLINICAL DATA: Arrives via EMS, was at hair dresser and had
projectile vomiting and diarrhea (no recent illness), unable to
obtain BP, sats 80s. Pt has AMS. Hx HTN, CHF

EXAM:
PORTABLE CHEST 1 VIEW

[AP]
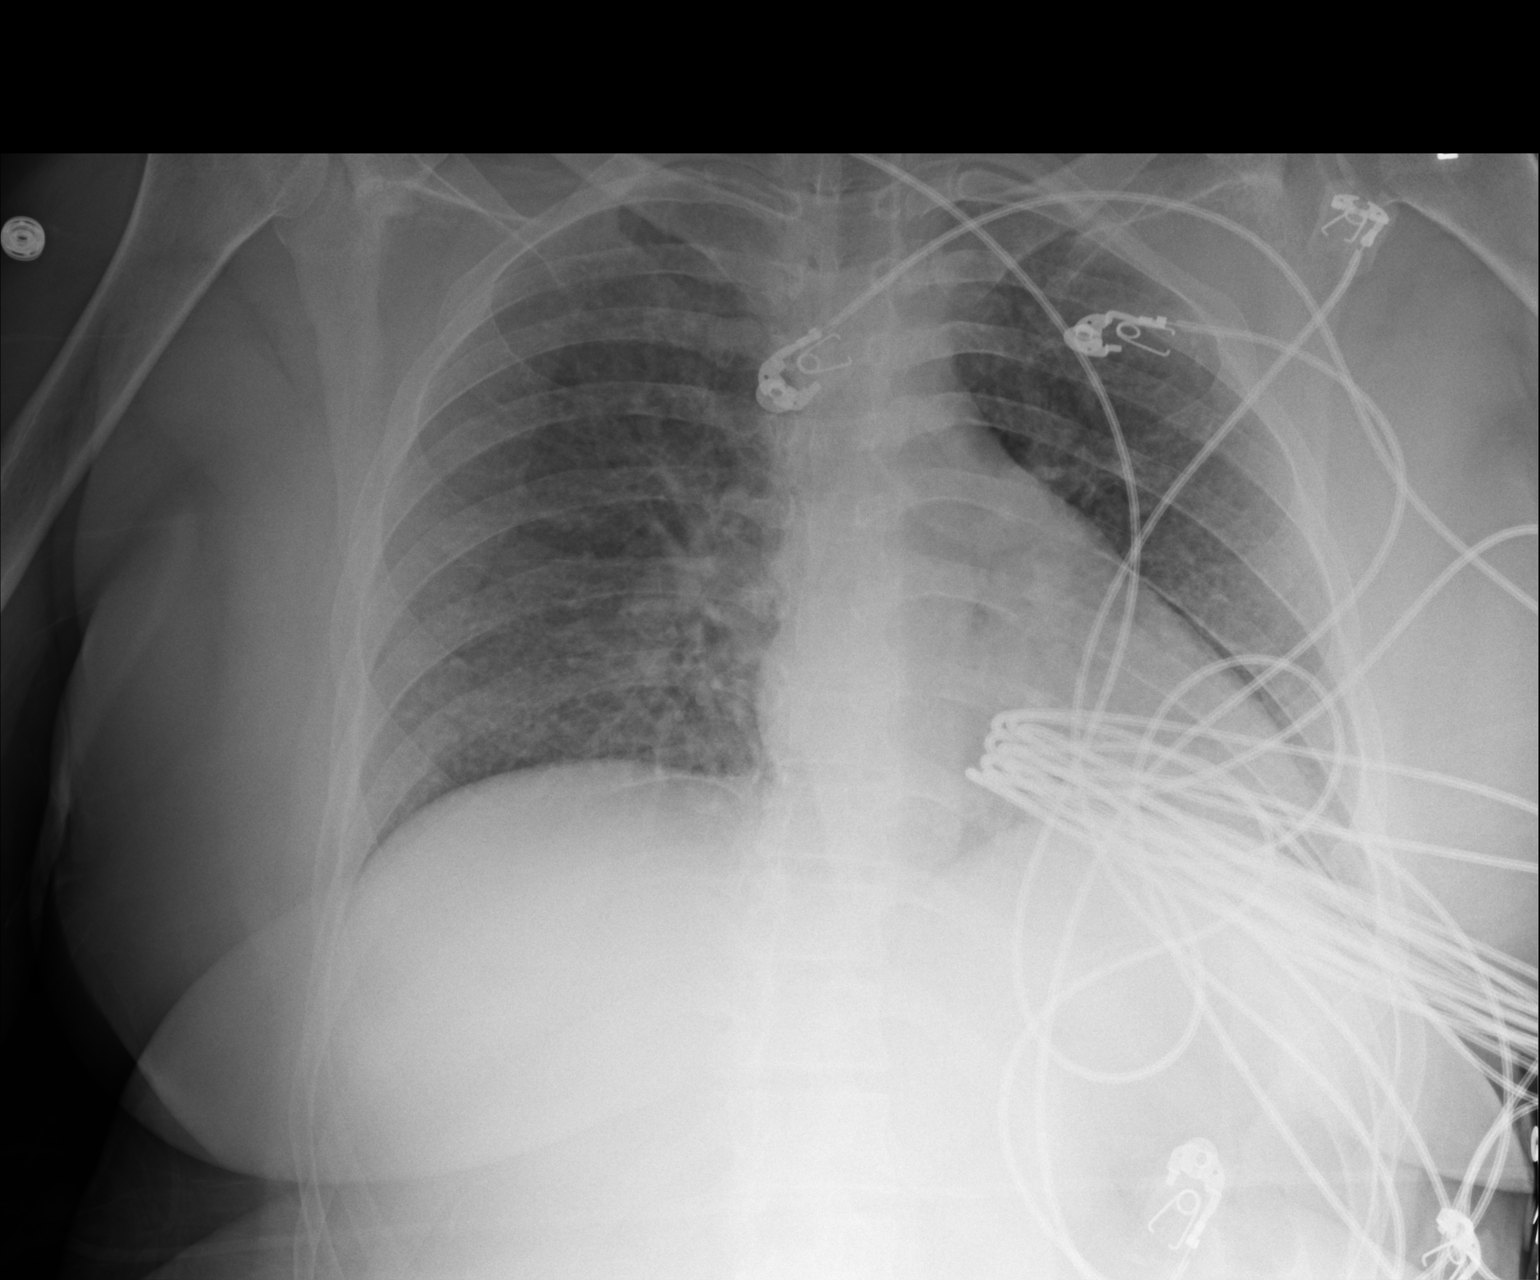

[1 of 1 positions shown; findings below may reference images not displayed]

FINDINGS: The heart size and mediastinal contours are within normal limits.
Both lungs are clear. No pleural effusion or pneumothorax. The
visualized skeletal structures are unremarkable.
IMPRESSION: No active disease.

## 2017-07-25 IMAGING — CT CT HEAD W/O CM
4 series · 18 of 47 positions shown, 20 images · non-contrast
Comparison: None.

CLINICAL DATA: Syncope

EXAM:
CT HEAD WITHOUT CONTRAST
TECHNIQUE: Contiguous axial images were obtained from the base of the skull
through the vertex without intravenous contrast.

[Series 201: head w/o, idose (1) · axial · non-contrast · 0.49mm/px · z∈[+79,+199]mm · 8 of 32 slices shown, 10 images]
[im 4/32  brain]
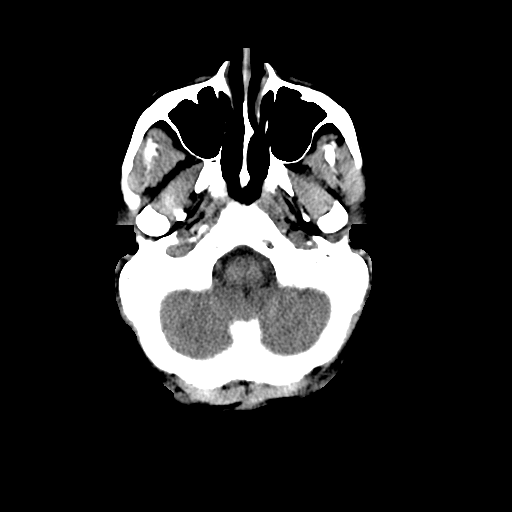
[im 4/32  bone]
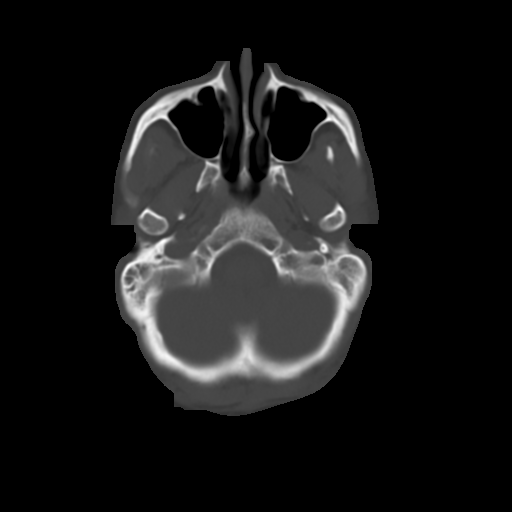
[im 7/32  brain]
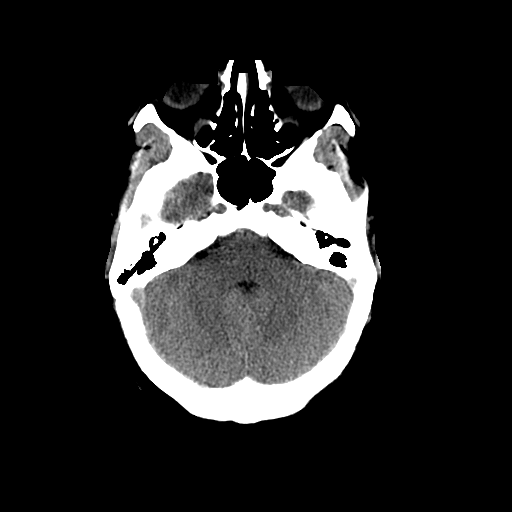
[im 11/32  brain]
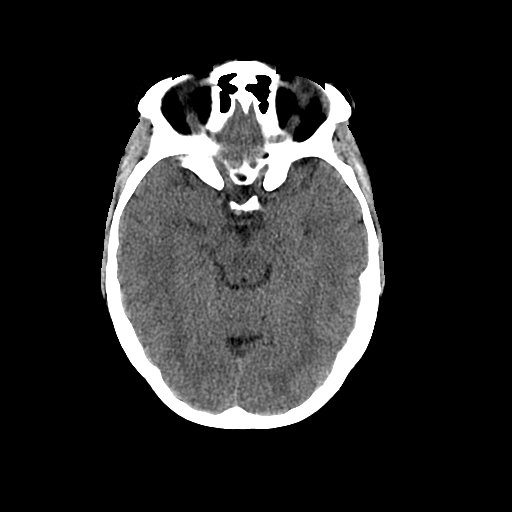
[im 14/32  brain]
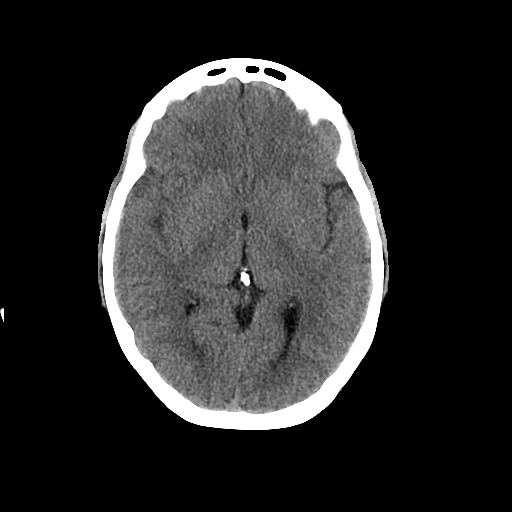
[im 18/32  brain]
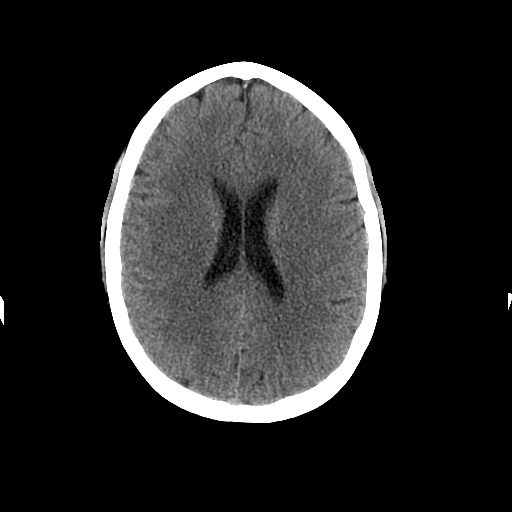
[im 18/32  bone]
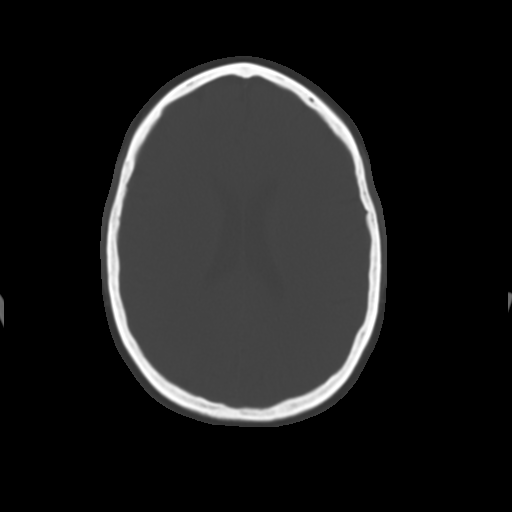
[im 21/32  brain]
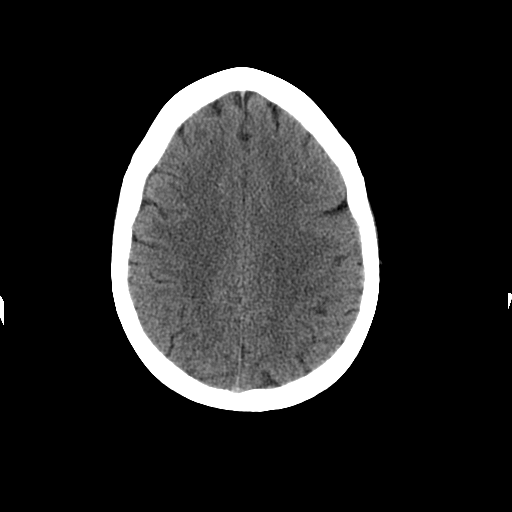
[im 25/32  brain]
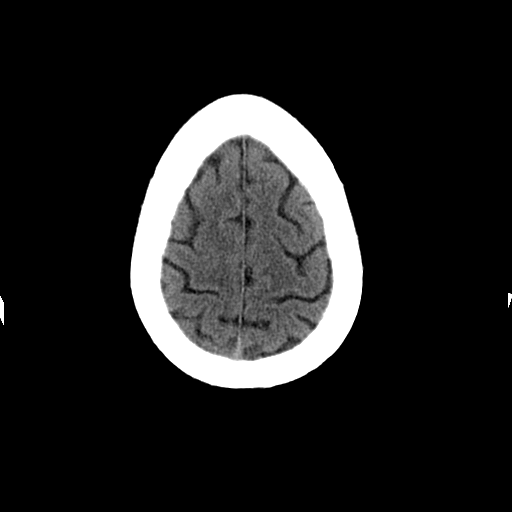
[im 28/32  brain]
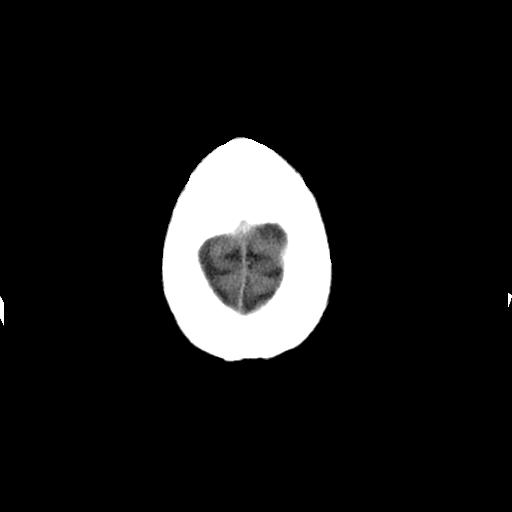

[Series 202: head w/o bone, idose (1) · axial · non-contrast · 0.49mm/px · z∈[+78,+128]mm · 4 of 64 slices shown]
[im 7/64  bone]
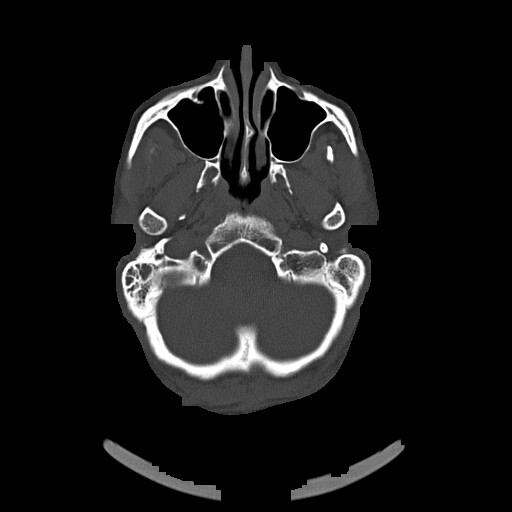
[im 14/64  bone]
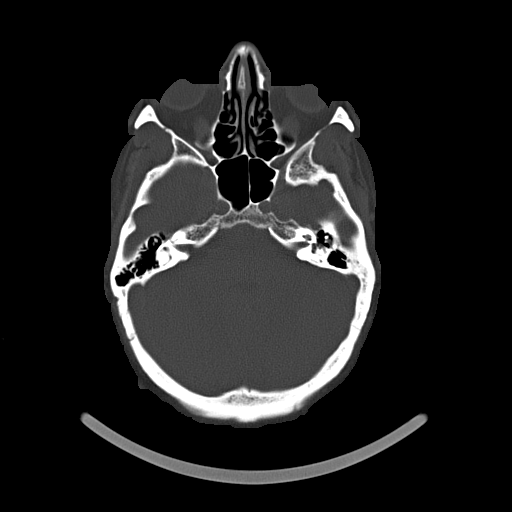
[im 20/64  bone]
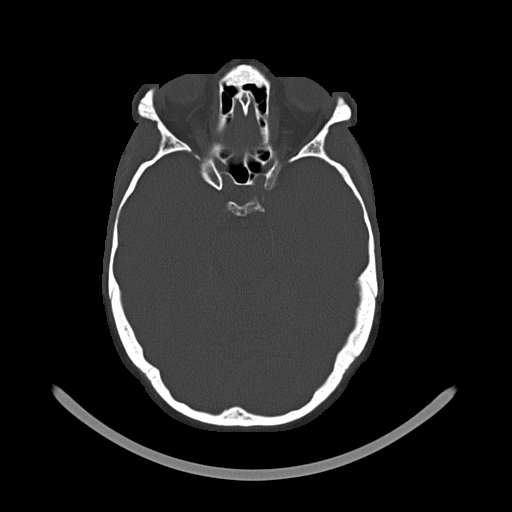
[im 27/64  bone]
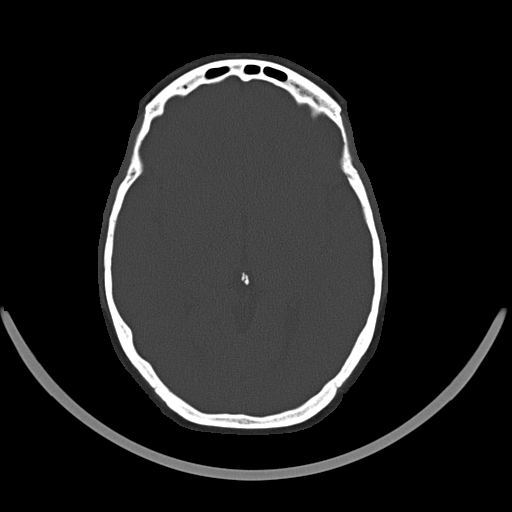

[Series 203: coronal st, idose (1) · coronal · 0.40mm/px · 3 of 82 slices shown]
[im 28/82  brain]
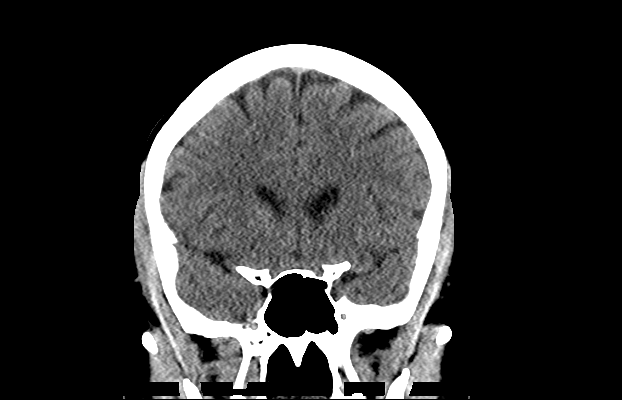
[im 37/82  brain]
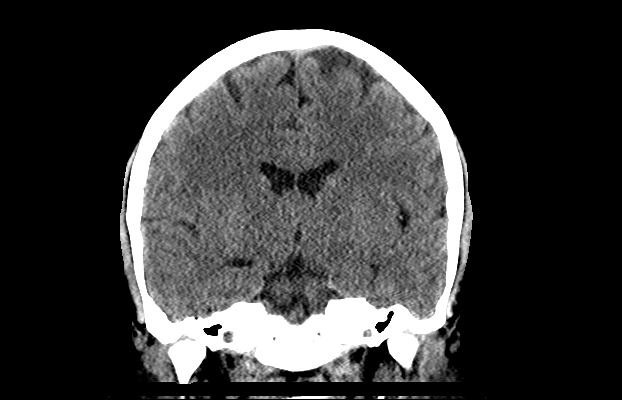
[im 46/82  brain]
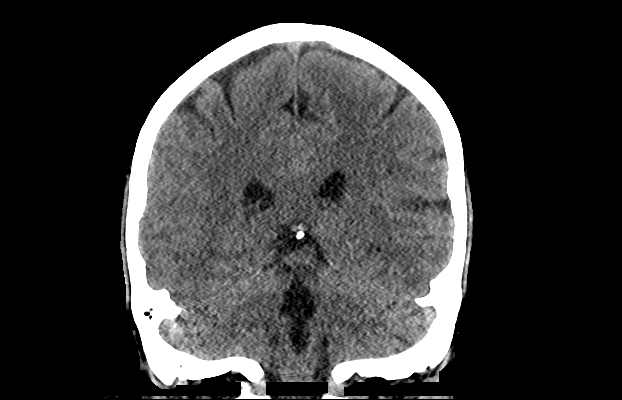

[Series 204: sagittal st, idose (1) · sagittal · 0.40mm/px · 3 of 83 slices shown]
[im 28/83  brain]
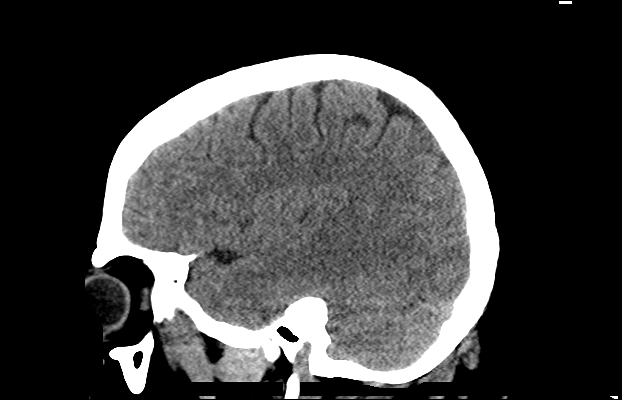
[im 42/83  brain]
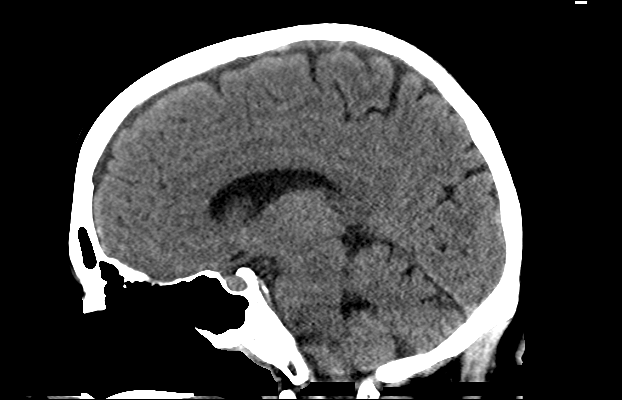
[im 55/83  brain]
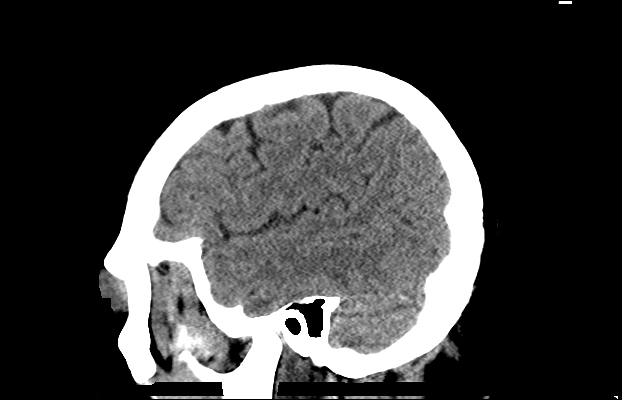

[18 of 47 positions shown; findings below may reference images not displayed]

FINDINGS: Brain parenchyma, ventricular system, and extra-axial space are
within normal limits. No mass effect, midline shift, or acute
hemorrhage. Cranium is intact. There is fluid in the right mastoid
air cells.
IMPRESSION: Fluid in the right mastoid air cells. Otherwise, no acute
intracranial pathology.

## 2020-12-30 NOTE — Progress Notes (Signed)
Patient ID: [name], female   DOB: Mar 18, 1976, 45 y.o.   MRN: [id number]       Patient did not show for appt. 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 She subsequently had complete recovery of her LV function in 2009. We have not seen her since 2015 and returns for evaluation   ECHO 4/17 EF 60-65%   Review of Systems: [y] = yes, [ ]  = no    General: Weight gain [] ; Weight loss [ ] ; Anorexia [ ] ; Fatigue [y]; Fever [ ] ; Chills [ ] ; Weakness ]   Cardiac: Chest pain/pressure [ ] ; Resting SOB [ y]; Exertional SOB [y]; Orthopnea [ ] ; Pedal Edema [] ; Palpitations [ ] ; Syncope [ ] ; Presyncope [ ] ; Paroxysmal nocturnal dyspnea[ ]    Pulmonary: Cough [ ] ; Wheezing[ ] ; Hemoptysis[ ] ; Sputum [ ] ; Snoring [ ]    GI: Vomiting[ ] ; Dysphagia[ ] ; Melena[ ] ; Hematochezia [ ] ; Heartburn[ ] ; Abdominal pain [ ] ; Constipation [ ] ; Diarrhea [ ] ; BRBPR [ ]    GU: Hematuria[ ] ; Dysuria [ ] ; Nocturia[ ]   Vascular: Pain in legs with walking [ ] ; Pain in feet with lying flat [ ] ; Non-healing sores [ ] ; Stroke [ ] ; TIA [ ] ; Slurred speech [ ] ;   Neuro: Headaches[ ] ; Vertigo[ ] ; Seizures[ ] ; Paresthesias[ ] ;Blurred vision [ ] ; Diplopia [ ] ; Vision changes [ ]    Ortho/Skin: Arthritis [y]; Joint pain [y]; Muscle pain [ ] ; Joint swelling [ ] ; Back Pain [ ] ; Rash [ ]    Psych: Depression[ ] ; Anxiety[ ]    Heme: Bleeding problems [ ] ; Clotting disorders [ ] ; Anemia [ ]    Endocrine: Diabetes [ ] ; Thyroid dysfunction[ ]    Past Medical History:  Diagnosis Date  . 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 LV function recovered EF 60% (6/09)   . HTN (hypertension)   . Ovarian cyst rupture   .  Post-infarction apical thrombus (HCC)    resovled    Current Outpatient Medications:  .  carvedilol (COREG) 6.25 MG tablet, Take 1 tablet (6.25 mg total) by mouth 2 (two) times daily., Disp: 180 tablet, Rfl: 3 .  cholecalciferol (VITAMIN D) 1000 units tablet, Take 1,000 Units by mouth daily., Disp: , Rfl:  .  furosemide (LASIX) 20 MG tablet, Take 1 tablet (20 mg total) by mouth daily., Disp: 90 tablet, Rfl: 3 .  lisinopril (PRINIVIL,ZESTRIL) 10 MG tablet, Take 1 tablet (10 mg total) by mouth daily., Disp: 90 tablet, Rfl: 3 .  metFORMIN (GLUCOPHAGE-XR) 500 MG 24 hr tablet, Take 500 mg by mouth 2 (two) times daily., Disp: , Rfl:  .  Multiple Vitamin (MULTIVITAMIN WITH MINERALS) TABS tablet, Take 1 tablet by mouth daily., Disp: , Rfl:  .  potassium chloride SA (KLOR-CON M20) 20 MEQ tablet, Take 1 tablet (20 mEq total) by mouth daily., Disp: 90 tablet, Rfl: 3  SH: Works full time at  [employer]: All systems negative except as listed in HPI, PMH and Problem List.  Past Medical History:  Diagnosis Date  . 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 LV function recovered EF 60% (6/09)   .  HTN (hypertension)   . Ovarian cyst rupture   . Post-infarction apical thrombus (HCC)    resovled    Current Outpatient Medications  Medication Sig Dispense Refill  . carvedilol (COREG) 6.25 MG tablet Take 1 tablet (6.25 mg total) by mouth 2 (two) times daily. 180 tablet 3  . cholecalciferol (VITAMIN D) 1000 units tablet Take 1,000 Units by mouth daily.    . furosemide (LASIX) 20 MG tablet Take 1 tablet (20 mg total) by mouth daily. 90 tablet 3  . lisinopril (PRINIVIL,ZESTRIL) 10 MG tablet Take 1 tablet (10 mg total) by mouth daily. 90 tablet 3  . metFORMIN (GLUCOPHAGE-XR) 500 MG 24 hr tablet Take 500 mg by mouth 2 (two) times daily.    . Multiple Vitamin (MULTIVITAMIN WITH MINERALS) TABS tablet Take 1 tablet by mouth daily.    . potassium chloride SA (KLOR-CON M20) 20 MEQ  tablet Take 1 tablet (20 mEq total) by mouth daily. 90 tablet 3   No current facility-administered medications for this encounter.   Social History   Socioeconomic History  . Marital status: Married    Spouse name: Not on file  . Number of children: 3  . Years of education: Not on file  . Highest education level: Not on file  Occupational History  . Not on file  Tobacco Use  . Smoking status: Former Smoker    Packs/day: 0.50    Years: 10.00    Pack years: 5.00    Types: Cigarettes    Quit date: 04/19/2006    Years since quitting: 14.7  . Smokeless tobacco: Never Used  Substance and Sexual Activity  . Alcohol use: No  . Drug use: No    Comment: Uses Vaporize  . Sexual activity: Not on file  Other Topics Concern  . Not on file  Social History Narrative   Does not smoke.    Social Determinants of Health   Financial Resource Strain: Not on file  Food Insecurity: Not on file  Transportation Needs: Not on file  Physical Activity: Not on file  Stress: Not on file  Social Connections: Not on file   Family History  Problem Relation Age of Onset  . Coronary artery disease Other     PHYSICAL EXAM: There were no vitals filed for this visit. General:  Well appearing. No resp difficulty HEENT: normal Neck: supple. JVP flat. Carotids 2+ bilaterally; no bruits. No lymphadenopathy or thryomegaly appreciated. Cor: PMI normal. Regular rate & rhythm. No rubs, gallops or murmurs. Lungs: clear Abdomen: soft, nontender, nondistended. No hepatosplenomegaly. No bruits or masses. Good bowel sounds. Extremities: no cyanosis, clubbing, rash, edema Neuro: alert & orientedx3, cranial nerves grossly intact. Moves all 4 extremities w/o difficulty. Affect pleasant.   ASSESSMENT & PLAN:  1. 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 Volume status stable. Lasix 20 mg daily and 20 meq potassium Continue carvedilol 6.25 mg twice a day and lisinopril 10 mg  daily.    [name] Bill,MD 11:16 PM

## 2021-01-01 ENCOUNTER — Inpatient Hospital Stay (HOSPITAL_BASED_OUTPATIENT_CLINIC_OR_DEPARTMENT_OTHER)
Admission: RE | Admit: 2021-01-01 | Discharge: 2021-01-01 | Disposition: A | Discharge status: Home / Self Care | Payer: Managed Care, Other (non HMO) | Source: Ambulatory Visit | Attending: Internal Medicine | Admitting: Internal Medicine

## 2021-01-01 DIAGNOSIS — O903 Peripartum cardiomyopathy: Secondary | ICD-10-CM

## 2021-03-25 ENCOUNTER — Encounter (HOSPITAL_COMMUNITY): Payer: Self-pay | Admitting: Internal Medicine

## 2021-04-14 DIAGNOSIS — D485 Neoplasm of uncertain behavior of skin: Secondary | ICD-10-CM | POA: Diagnosis not present

## 2021-04-14 DIAGNOSIS — L918 Other hypertrophic disorders of the skin: Secondary | ICD-10-CM | POA: Diagnosis not present

## 2021-04-27 DIAGNOSIS — L600 Ingrowing nail: Secondary | ICD-10-CM | POA: Diagnosis not present

## 2021-04-27 DIAGNOSIS — L089 Local infection of the skin and subcutaneous tissue, unspecified: Secondary | ICD-10-CM | POA: Diagnosis not present

## 2021-07-05 ENCOUNTER — Encounter (HOSPITAL_COMMUNITY): Payer: Self-pay | Admitting: Internal Medicine

## 2021-07-05 ENCOUNTER — Ambulatory Visit (HOSPITAL_COMMUNITY)
Admission: RE | Admit: 2021-07-05 | Discharge: 2021-07-05 | Disposition: A | Discharge status: Home / Self Care | Payer: BC Managed Care – PPO | Source: Ambulatory Visit | Attending: Internal Medicine | Admitting: Internal Medicine

## 2021-07-05 ENCOUNTER — Other Ambulatory Visit (HOSPITAL_COMMUNITY): Payer: Self-pay

## 2021-07-05 ENCOUNTER — Other Ambulatory Visit: Payer: Self-pay

## 2021-07-05 VITALS — BP 182/98 | HR 101 | Wt 214.6 lb

## 2021-07-05 DIAGNOSIS — O903 Peripartum cardiomyopathy: Secondary | ICD-10-CM | POA: Diagnosis not present

## 2021-07-05 DIAGNOSIS — Z8249 Family history of ischemic heart disease and other diseases of the circulatory system: Secondary | ICD-10-CM | POA: Insufficient documentation

## 2021-07-05 DIAGNOSIS — Z79899 Other long term (current) drug therapy: Secondary | ICD-10-CM | POA: Diagnosis not present

## 2021-07-05 DIAGNOSIS — I110 Hypertensive heart disease with heart failure: Secondary | ICD-10-CM | POA: Diagnosis not present

## 2021-07-05 DIAGNOSIS — Z87891 Personal history of nicotine dependence: Secondary | ICD-10-CM | POA: Diagnosis not present

## 2021-07-05 DIAGNOSIS — I10 Essential (primary) hypertension: Secondary | ICD-10-CM

## 2021-07-05 DIAGNOSIS — R739 Hyperglycemia, unspecified: Secondary | ICD-10-CM

## 2021-07-05 LAB — CBC
HCT: 43.6 % (ref 36.0–46.0)
Hemoglobin: 15.2 g/dL — ABNORMAL HIGH (ref 12.0–15.0)
MCH: 29.6 pg (ref 26.0–34.0)
MCHC: 34.9 g/dL (ref 30.0–36.0)
MCV: 84.8 fL (ref 80.0–100.0)
Platelets: 335 10*3/uL (ref 150–400)
RBC: 5.14 MIL/uL — ABNORMAL HIGH (ref 3.87–5.11)
RDW: 12.1 % (ref 11.5–15.5)
WBC: 5.1 10*3/uL (ref 4.0–10.5)
nRBC: 0 % (ref 0.0–0.2)

## 2021-07-05 LAB — COMPREHENSIVE METABOLIC PANEL
ALT: 17 U/L (ref 0–44)
AST: 21 U/L (ref 15–41)
Albumin: 4.5 g/dL (ref 3.5–5.0)
Alkaline Phosphatase: 68 U/L (ref 38–126)
Anion gap: 11 (ref 5–15)
BUN: 11 mg/dL (ref 6–20)
CO2: 27 mmol/L (ref 22–32)
Calcium: 10 mg/dL (ref 8.9–10.3)
Chloride: 100 mmol/L (ref 98–111)
Creatinine, Ser: 0.79 mg/dL (ref 0.44–1.00)
GFR, Estimated: >60 mL/min (ref >60)
Glucose, Bld: 142 mg/dL — ABNORMAL HIGH (ref 70–99)
Potassium: 3.8 mmol/L (ref 3.5–5.1)
Sodium: 138 mmol/L (ref 135–145)
Total Bilirubin: 0.7 mg/dL (ref 0.3–1.2)
Total Protein: 8.1 g/dL (ref 6.5–8.1)

## 2021-07-05 LAB — T4, FREE: Free T4: 0.99 ng/dL (ref 0.61–1.12)

## 2021-07-05 LAB — TSH: TSH: 1.973 u[IU]/mL (ref 0.350–4.500)

## 2021-07-05 LAB — HEMOGLOBIN A1C
Hgb A1c MFr Bld: 6.9 % — ABNORMAL HIGH (ref 4.8–5.6)
Mean Plasma Glucose: 151.33 mg/dL

## 2021-07-05 MED ORDER — FUROSEMIDE 20 MG PO TABS: 20.0000 mg | ORAL_TABLET | Freq: Every day | ORAL | 3 refills | Status: DC | PRN

## 2021-07-05 MED ORDER — CARVEDILOL 6.25 MG PO TABS: 6.2500 mg | ORAL_TABLET | Freq: Two times a day (BID) | ORAL | 3 refills | Status: DC

## 2021-07-05 MED ORDER — ENTRESTO 49-51 MG PO TABS: 1.0000 | ORAL_TABLET | Freq: Two times a day (BID) | ORAL | 3 refills | Status: DC

## 2021-07-05 NOTE — Addendum Note (Signed)
Encounter addended by: Chinita Pester, CMA on: 07/05/2021 11:16 AM  Actions taken: Medication long-term status modified, Visit diagnoses modified, Order list changed, Diagnosis association updated, Pharmacy for encounter modified, Charge Capture section accepted, Clinical Note Signed

## 2021-07-05 NOTE — Patient Instructions (Addendum)
EKG done today.  Labs done today. We will contact you only if your labs are abnormal.  DO NOT RESTART Lisinopril   DECREASE Lasix to 1 tablet by mouth daily as needed.  START Entresto 49-51mg  (1 tablet) by mouth 2 times daily.  No other medication changes were made. Please continue all current medications as prescribed.  Your physician has requested that you regularly monitor and record your blood pressure readings at home. Please use the same machine at the same time of day to check your readings and record them to bring to your follow-up visit. A log was provided to you during your appointment today.   You have been referred to the [facility]. They will contact you to schedule an appointment.   Your physician recommends that you schedule a follow-up appointment in: 9 months, please contact our office in April 2023 for a May appointment.   If you have any questions or concerns before your next appointment please send [place] a message through [place] or call our office at [phone].    TO LEAVE A MESSAGE FOR THE NURSE SELECT OPTION 2, PLEASE LEAVE A MESSAGE INCLUDING: YOUR NAME DATE OF BIRTH CALL BACK NUMBER REASON FOR CALL**this is important as we prioritize the call backs  YOU WILL RECEIVE A CALL BACK THE SAME DAY AS LONG AS YOU CALL BEFORE 4:00 PM   Do the following things EVERYDAY: Weigh yourself in the morning before breakfast. Write it down and keep it in a log. Take your medicines as prescribed Eat low salt foods--Limit salt (sodium) to 2000 mg per day.  Stay as active as you can everyday Limit all fluids for the day to less than 2 liters   At the [facility], you and your health needs are our priority. As part of our continuing mission to provide you with exceptional heart care, we have created designated Provider Care Teams. These Care Teams include your primary Cardiologist (physician) and Advanced Practice Providers (APPs- Physician Assistants  and Nurse Practitioners) who all work together to provide you with the care you need, when you need it.   You may see any of the following providers on your designated Care Team at your next follow up: Dr Arvilla Meres Dr Carron Curie, NP Robbie Lis, [place] Karle Plumber, PharmD   Please be sure to bring in all your medications bottles to every appointment.

## 2021-07-05 NOTE — Addendum Note (Signed)
Encounter addended by: Chinita Pester, CMA on: 07/05/2021 11:19 AM  Actions taken: Order list changed, Pharmacy for encounter modified, Clinical Note Signed

## 2021-07-05 NOTE — Addendum Note (Signed)
Encounter addended by: Chinita Pester, CMA on: 07/05/2021 11:26 AM  Actions taken: Order list changed, Pharmacy for encounter modified

## 2021-07-05 NOTE — Progress Notes (Addendum)
Patient ID: [name], female   DOB: 01/15/1976, 45 y.o.   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 She subsequently had complete recovery of her LV function in 2009. We have not seen her since 2015 and returns for evaluation  ECHO 4/17 EF 60-65%   Says she feels great. Exercising regularly in the gym with cardio and weights. No CP or SOB. Here for further evaluation of her high blood pressure. Does not take BP regularly.   Review of Systems: [y] = yes, [ ]  = no      General: Weight gain [ ] ; Weight loss [ ] ; Anorexia [ ] ; Fatigue [ ] ; Fever [ ] ; Chills [ ] ; Weakness ]   Cardiac: Chest pain/pressure [ ] ; Resting SOB [ y]; Exertional SOB [ ] ; Orthopnea [ ] ; Pedal Edema [] ; Palpitations [ ] ; Syncope [ ] ; Presyncope [ ] ; Paroxysmal nocturnal dyspnea[ ]    Pulmonary: Cough [ ] ; Wheezing[ ] ; Hemoptysis[ ] ; Sputum [ ] ; Snoring [ ]    GI: Vomiting[ ] ; Dysphagia[ ] ; Melena[ ] ; Hematochezia [ ] ; Heartburn[ ] ; Abdominal pain [ ] ; Constipation [ ] ; Diarrhea [ ] ; BRBPR [ ]    GU: Hematuria[ ] ; Dysuria [ ] ; Nocturia[ ]  Vascular: Pain in legs with walking [ ] ; Pain in feet with lying flat [ ] ; Non-healing sores [ ] ; Stroke [ ] ; TIA [ ] ; Slurred speech [ ] ;   Neuro: Headaches[ ] ; Vertigo[ ] ; Seizures[ ] ; Paresthesias[ ] ;Blurred vision [ ] ; Diplopia [ ] ; Vision changes [ ]    Ortho/Skin: Arthritis [ ] ; Joint pain [ ] ; Muscle pain [ ] ; Joint swelling [ ] ; Back Pain [ ] ; Rash [ ]    Psych: Depression[ ] ; Anxiety[ ]    Heme: Bleeding problems [ ] ; Clotting disorders [ ] ; Anemia [ ]    Endocrine: Diabetes [ ] ; Thyroid dysfunction[ ]    Past Medical History:  Diagnosis Date   CHF (congestive heart failure) (HCC)    h/o peripartum CM EF 20%, onset 12/08. LV function recovered EF 60%  (6/09)    HTN (hypertension)    Ovarian cyst rupture    Post-infarction apical thrombus (HCC)    resovled    Current Outpatient Medications:    carvedilol (COREG) 6.25 MG tablet, Take 1 tablet (6.25 mg total) by mouth 2 (two) times daily., Disp: 180 tablet, Rfl: 3   cholecalciferol (VITAMIN D) 1000 units tablet, Take 1,000 Units by mouth daily., Disp: , Rfl:    furosemide (LASIX) 20 MG tablet, Take 1 tablet (20 mg total) by mouth daily., Disp: 90 tablet, Rfl: 3   lisinopril (PRINIVIL,ZESTRIL) 10 MG tablet, Take 1 tablet (10 mg total) by mouth daily., Disp: 90 tablet, Rfl: 3   Multiple Vitamin (MULTIVITAMIN WITH MINERALS) TABS tablet, Take 1 tablet by mouth daily., Disp: , Rfl:    potassium chloride SA (KLOR-CON M20) 20 MEQ tablet, Take 1 tablet (20 mEq total) by mouth daily., Disp: 90 tablet, Rfl: 3  SH: No tobacco or drugs. Two 8th grade twins   ROS: All systems negative except as listed in HPI, PMH and Problem List.  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 LV function recovered EF 60% (  6/09)    HTN (hypertension)    Ovarian cyst rupture    Post-infarction apical thrombus (HCC)    resovled    Current Outpatient Medications  Medication Sig Dispense Refill   carvedilol (COREG) 6.25 MG tablet Take 1 tablet (6.25 mg total) by mouth 2 (two) times daily. 180 tablet 3   cholecalciferol (VITAMIN D) 1000 units tablet Take 1,000 Units by mouth daily.     furosemide (LASIX) 20 MG tablet Take 1 tablet (20 mg total) by mouth daily. 90 tablet 3   lisinopril (PRINIVIL,ZESTRIL) 10 MG tablet Take 1 tablet (10 mg total) by mouth daily. 90 tablet 3   Multiple Vitamin (MULTIVITAMIN WITH MINERALS) TABS tablet Take 1 tablet by mouth daily.     potassium chloride SA (KLOR-CON M20) 20 MEQ tablet Take 1 tablet (20 mEq total) by mouth daily. 90 tablet 3   No current facility-administered medications for this encounter.    Social History   Socioeconomic History   Marital status: Married    Spouse name: Not on file   Number of children: 3   Years of education: Not on file   Highest education level: Not on file  Occupational History   Not on file  Tobacco Use   Smoking status: Former    Packs/day: 0.50    Years: 10.00    Pack years: 5.00    Types: Cigarettes    Quit date: 04/19/2006    Years since quitting: 15.2   Smokeless tobacco: Never  Substance and Sexual Activity   Alcohol use: No   Drug use: No    Comment: Uses Vaporize   Sexual activity: Not on file  Other Topics Concern   Not on file  Social History Narrative   Does not smoke.    Social Determinants of Health   Financial Resource Strain: Not on file  Food Insecurity: Not on file  Transportation Needs: Not on file  Physical Activity: Not on file  Stress: Not on file  Social Connections: Not on file   Family History  Problem Relation Age of Onset   Coronary artery disease Other     PHYSICAL EXAM: Vitals:   07/05/21 1023 07/05/21 1035  BP: (!) 214/94 (!) 182/98  Pulse: (!) 101   SpO2: 99%    General:  Well appearing. No resp difficulty HEENT: normal Neck: supple. no JVD. Carotids 2+ bilat; no bruits. No lymphadenopathy or thryomegaly appreciated. Cor: PMI nondisplaced. Regular rate & rhythm. No rubs, gallops or murmurs. Lungs: clear Abdomen: soft, nontender, nondistended. No hepatosplenomegaly. No bruits or masses. Good bowel sounds. Extremities: no cyanosis, clubbing, rash, edema Neuro: alert & orientedx3, cranial nerves grossly intact. moves all 4 extremities w/o difficulty. Affect pleasant  NSR 96 No ST-T wave abnormalities. Personally reviewed   ASSESSMENT & PLAN:  1. Peri-partum Cardiomyopathy in 2008 with EF 20% -  ECHO 2017 EF 60-65% - Resolved - On good meds. - NYHA I.  2. Essential Hypertension - BP quite high here but hasn't taken her meds for 2 days - Will switch lisinopril to Entresto 49/51  bid - Change lasix to as needed only - Keep BP log daily - Check labs today - Refer to HTN Clinic - Consider sleep study if BP not easily controlled  Roman Sandall,MD 10:54 AM

## 2021-07-06 LAB — T3, FREE: T3, Free: 3.4 pg/mL (ref 2.0–4.4)

## 2021-09-06 ENCOUNTER — Ambulatory Visit (HOSPITAL_BASED_OUTPATIENT_CLINIC_OR_DEPARTMENT_OTHER): Payer: BC Managed Care – PPO | Admitting: Cardiovascular Disease

## 2021-09-06 ENCOUNTER — Other Ambulatory Visit: Payer: Self-pay

## 2021-09-06 ENCOUNTER — Encounter (HOSPITAL_BASED_OUTPATIENT_CLINIC_OR_DEPARTMENT_OTHER): Payer: Self-pay | Admitting: Cardiovascular Disease

## 2021-09-06 DIAGNOSIS — I10 Essential (primary) hypertension: Secondary | ICD-10-CM | POA: Diagnosis not present

## 2021-09-06 DIAGNOSIS — O903 Peripartum cardiomyopathy: Secondary | ICD-10-CM | POA: Diagnosis not present

## 2021-09-06 HISTORY — DX: Essential (primary) hypertension: I10

## 2021-09-06 NOTE — Progress Notes (Signed)
[facility] Initial Assessment:    Date:  09/06/2021   ID:  [name], DOB June 06, 1976, MRN [id number]  PCP:  Pcp, No  Cardiologist:  None  Nephrologist:  Referring MD: Dolores Patty, MD   CC: Hypertension  History of Present Illness:    [name] is a 45 y.o. female with a hx of hypertension, congestive heart failure, and peripartum cardiomyopathy (LVEF normalized) here to establish care in the [facility].   She saw Dr. Gala Romney 07/05/2021 and her blood pressure was 214/94 with a heart rate of 101 bpm, and 182/98 about 10 min later. EKG at that visit showed NSR, rate 96, and No ST/T wave abnormalities. It was noted that she hadn't taken her antihypertensives for 2 days. Lisinopril was switched to Entresto 49/51 bid, and Lasix was changed to PRN. She was referred to the [facility].  Today, she reports having hypertension for 24 years beginning with her first pregnancy.  She developed peripartum cardiomyopathy with her twins 14 years ago.  LVEF was 20% at the time and has subsequently normalized with guideline directed medical therapy.  At home she checks her blood pressure intermittently.  Since she was started on the [facility], she has seen readings averaging 120-130s/75-85.  Initially she felt poorly on Entresto but now feels well.  At times she struggles with remembering to take her medicines regularly.  She endorses "a little" left LE edema at times. Exercise begins at 5:30 AM, for 5 days a week. Her routine includes push-pull leg exercises with weight lifting, and 5 miles on the stationary bike. She feels great while exercising. In her diet she tries to watch her salt intake. She mostly drinks water or a protein shake, and avoids soft drinks. Former smoker, she quit 14 years ago when she became pregnant with her twins. Initially, Entresto made her feel ill and sluggish. However, she believes her body is now more acclimated to it and  she feels better overall. She denies any palpitations, chest pain, or shortness of breath. No lightheadedness, headaches, syncope, orthopnea, or PND.   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 LV function recovered EF 60% (6/09)    Essential hypertension 09/06/2021   HTN (hypertension)    Ovarian cyst rupture    Post-infarction apical thrombus (HCC)    resovled    Past Surgical History:  Procedure Laterality Date   CESAREAN SECTION     TONSILLECTOMY      Current Medications: Current Meds  Medication Sig   carvedilol (COREG) 6.25 MG tablet Take 1 tablet (6.25 mg total) by mouth 2 (two) times daily.   cholecalciferol (VITAMIN D) 1000 units tablet Take 1,000 Units by mouth daily.   furosemide (LASIX) 20 MG tablet Take 1 tablet (20 mg total) by mouth daily as needed.   Multiple Vitamin (MULTIVITAMIN WITH MINERALS) TABS tablet Take 1 tablet by mouth daily.   potassium chloride SA (KLOR-CON M20) 20 MEQ tablet Take 1 tablet (20 mEq total) by mouth daily.   sacubitril-valsartan (ENTRESTO) 49-51 MG Take 1 tablet by mouth 2 (two) times daily.     Allergies:   Patient has no known allergies.   Social History   Socioeconomic History   Marital status: Married    Spouse name: Not on file   Number of children: 3   Years of education: Not on file   Highest education level:  Not on file  Occupational History   Not on file  Tobacco Use   Smoking status: Former    Packs/day: 0.50    Years: 10.00    Pack years: 5.00    Types: Cigarettes    Quit date: 04/19/2006    Years since quitting: 15.3   Smokeless tobacco: Never  Substance and Sexual Activity   Alcohol use: No   Drug use: No    Comment: Uses Vaporize   Sexual activity: Not on file  Other Topics Concern   Not on file  Social History Narrative   Does not smoke.    Social Determinants of Health   Financial Resource Strain: Low Risk    Difficulty  of Paying Living Expenses: Not hard at all  Food Insecurity: No Food Insecurity   Worried About Programme researcher, broadcasting/film/video in the Last Year: Never true   Ran Out of Food in the Last Year: Never true  Transportation Needs: No Transportation Needs   Lack of Transportation (Medical): No   Lack of Transportation (Non-Medical): No  Physical Activity: Sufficiently Active   Days of Exercise per Week: 5 days   Minutes of Exercise per Session: 60 min  Stress: Not on file  Social Connections: Not on file     Family History: The patient's family history includes Coronary artery disease in her father and another family member; Heart attack in her brother; Heart failure in her father and paternal grandmother; Hypertension in her father and maternal grandmother; Thyroid disease in her mother; Transient ischemic attack in her maternal grandmother.  ROS:   Please see the history of present illness.    (+) Left LE edema All other systems reviewed and are negative.  EKGs/Labs/Other Studies Reviewed:    Echo 02/14/2016: - Left ventricle: The cavity size was normal. Wall thickness was    normal. Systolic function was normal. The estimated ejection    fraction was in the range of 60% to 65%. Wall motion was normal;    there were no regional wall motion abnormalities. Left    ventricular diastolic function parameters were normal.  - Pulmonic valve: There was mild regurgitation.   EKG:   09/06/2021: EKG was not ordered today.   Recent Labs: 07/05/2021: ALT 17; BUN 11; Creatinine, Ser 0.79; Hemoglobin 15.2; Platelets 335; Potassium 3.8; Sodium 138; TSH 1.973   Recent Lipid Panel    Component Value Date/Time   CHOL  10/13/2007 0055    140        ATP III CLASSIFICATION:  <200     mg/dL   Desirable  540-086  mg/dL   Borderline High  >=761    mg/dL   High   TRIG 950 (H) 93/26/7124 0055   HDL 20 (L) 10/13/2007 0055   CHOLHDL 7.0 10/13/2007 0055   VLDL 41 (H) 10/13/2007 0055   LDLCALC  10/13/2007 0055     79        Total Cholesterol/HDL:CHD Risk Coronary Heart Disease Risk Table                     Men   Women  1/2 Average Risk   3.4   3.3    Physical Exam:   VS:  BP 126/70 (BP Location: Right Arm, Patient Position: Sitting, Cuff Size: Large)   Pulse 67   Ht 5' 7.5" (1.715 m)   Wt 215 lb 6.4 oz (97.7 kg)   SpO2 99%   BMI 33.24 kg/m  ,  BMI Body mass index is 33.24 kg/m. GENERAL:  Well appearing HEENT: Pupils equal round and reactive, fundi not visualized, oral mucosa unremarkable NECK:  No jugular venous distention, waveform within normal limits, carotid upstroke brisk and symmetric, no bruits, no thyromegaly LUNGS:  Clear to auscultation bilaterally HEART:  RRR.  PMI not displaced or sustained,S1 and S2 within normal limits, no S3, no S4, no clicks, no rubs, no murmurs ABD:  Flat, positive bowel sounds normal in frequency in pitch, no bruits, no rebound, no guarding, no midline pulsatile mass, no hepatomegaly, no splenomegaly EXT:  2 plus pulses throughout, no edema, no cyanosis no clubbing SKIN:  No rashes no nodules NEURO:  Cranial nerves II through XII grossly intact, motor grossly intact throughout PSYCH:  Cognitively intact, oriented to person place and time   ASSESSMENT/PLAN:    Essential hypertension Blood pressure was extremely elevated when she was seen in heart failure clinic.  However she had not been taking her medication at the time.  Today her blood pressure is much better controlled and is at goal on 2 medications.  Therefore we will not trigger a work-up for secondary causes.  Continue carvedilol and Entresto.  Continue exercising regularly and limiting sodium intake.  Continue to abstain from smoking.  Postpartum cardiomyopathy LVEF recovered after the birth of her twins.  She is euvolemic on exam today and rarely needs furosemide.  Continue carvedilol and Entresto.   Screening for Secondary Hypertension: {  Causes 09/06/2021  Drugs/Herbals Screened   Renovascular HTN N/A  Sleep Apnea N/A  Thyroid Disease Screened  Hyperaldosteronism N/A  Pheochromocytoma N/A  Cushing's Syndrome N/A  Hyperparathyroidism N/A  Coarctation of the Aorta Screened     - Comments BP symmetric  Compliance Screened     - Comments forgets meds    Relevant Labs/Studies: Basic Labs Latest Ref Rng & Units 07/05/2021 04/27/2016 02/13/2016  Sodium 135 - 145 mmol/L 138 138 141  Potassium 3.5 - 5.1 mmol/L 3.8 3.6 3.3(L)  Creatinine 0.44 - 1.00 mg/dL 3.57 0.17(B) 9.39             Disposition:    FU with Arvilla Meres, MD in 1 year. FU with Kaytelynn Scripter C. Duke Salvia, MD, [facility]  PRN.   Medication Adjustments/Labs and Tests Ordered: Current medicines are reviewed at length with the patient today.  Concerns regarding medicines are outlined above.   No orders of the defined types were placed in this encounter.  No orders of the defined types were placed in this encounter.   I,Mathew Stumpf,acting as a Neurosurgeon for Chilton Si, MD.,have documented all relevant documentation on the behalf of Chilton Si, MD,as directed by  Chilton Si, MD while in the presence of Chilton Si, MD.  I, Aune Adami C. Duke Salvia, MD have reviewed all documentation for this visit.  The documentation of the exam, diagnosis, procedures, and orders on 09/06/2021 are all accurate and complete.   Signed, Chilton Si, MD  09/06/2021 8:31 AM    [facility]

## 2021-09-06 NOTE — Assessment & Plan Note (Signed)
LVEF recovered after the birth of her twins.  She is euvolemic on exam today and rarely needs furosemide.  Continue carvedilol and Entresto.

## 2021-09-06 NOTE — Patient Instructions (Addendum)
Medication Instructions:  Your physician recommends that you continue on your current medications as directed. Please refer to the Current Medication list given to you today.   Labwork: NONE  Testing/Procedures: NONE  Follow-Up: 1 YEAR WITH DR BENSIMHON   AS NEEDED WITH DR Ault

## 2021-09-06 NOTE — Assessment & Plan Note (Addendum)
Blood pressure was extremely elevated when she was seen in heart failure clinic.  However she had not been taking her medication at the time.  Today her blood pressure is much better controlled and is at goal on 2 medications.  Therefore we will not trigger a work-up for secondary causes.  Continue carvedilol and Entresto.  Continue exercising regularly and limiting sodium intake.  Continue to abstain from smoking.

## 2022-04-29 DIAGNOSIS — Z01419 Encounter for gynecological examination (general) (routine) without abnormal findings: Secondary | ICD-10-CM | POA: Diagnosis not present

## 2022-04-29 DIAGNOSIS — Z1151 Encounter for screening for human papillomavirus (HPV): Secondary | ICD-10-CM | POA: Diagnosis not present

## 2022-04-29 DIAGNOSIS — Z1231 Encounter for screening mammogram for malignant neoplasm of breast: Secondary | ICD-10-CM | POA: Diagnosis not present

## 2022-06-25 ENCOUNTER — Other Ambulatory Visit (HOSPITAL_COMMUNITY): Payer: Self-pay | Admitting: Internal Medicine

## 2022-06-27 ENCOUNTER — Other Ambulatory Visit (HOSPITAL_COMMUNITY): Payer: Self-pay | Admitting: *Deleted

## 2022-06-29 ENCOUNTER — Other Ambulatory Visit (HOSPITAL_COMMUNITY): Payer: Self-pay

## 2022-06-29 MED ORDER — FUROSEMIDE 20 MG PO TABS: 20.0000 mg | ORAL_TABLET | Freq: Every day | ORAL | 1 refills | Status: DC | PRN

## 2022-08-23 ENCOUNTER — Other Ambulatory Visit (HOSPITAL_COMMUNITY): Payer: Self-pay | Admitting: Cardiology

## 2022-12-21 ENCOUNTER — Other Ambulatory Visit (HOSPITAL_COMMUNITY): Payer: Self-pay | Admitting: Internal Medicine

## 2023-02-20 DIAGNOSIS — M7752 Other enthesopathy of left foot: Secondary | ICD-10-CM | POA: Diagnosis not present

## 2023-02-20 DIAGNOSIS — M25572 Pain in left ankle and joints of left foot: Secondary | ICD-10-CM | POA: Diagnosis not present

## 2023-03-13 DIAGNOSIS — M79672 Pain in left foot: Secondary | ICD-10-CM | POA: Diagnosis not present

## 2023-03-13 DIAGNOSIS — M722 Plantar fascial fibromatosis: Secondary | ICD-10-CM | POA: Diagnosis not present

## 2023-03-13 DIAGNOSIS — S96919D Strain of unspecified muscle and tendon at ankle and foot level, unspecified foot, subsequent encounter: Secondary | ICD-10-CM | POA: Diagnosis not present

## 2023-03-13 DIAGNOSIS — M24572 Contracture, left ankle: Secondary | ICD-10-CM | POA: Diagnosis not present

## 2023-06-30 ENCOUNTER — Other Ambulatory Visit (HOSPITAL_COMMUNITY): Payer: Self-pay

## 2023-06-30 DIAGNOSIS — I10 Essential (primary) hypertension: Secondary | ICD-10-CM

## 2023-06-30 MED ORDER — CARVEDILOL 6.25 MG PO TABS: 6.2500 mg | ORAL_TABLET | Freq: Two times a day (BID) | ORAL | 0 refills | Status: DC

## 2024-01-05 ENCOUNTER — Telehealth (HOSPITAL_COMMUNITY): Payer: Self-pay

## 2024-01-05 NOTE — Telephone Encounter (Signed)
 Received a fax requesting medical records from [facility]. Records were successfully faxed to: [phone] ,which was the number provided.. Medical request form will be scanned into patients chart.

## 2024-04-10 ENCOUNTER — Other Ambulatory Visit (HOSPITAL_COMMUNITY): Payer: Self-pay | Admitting: Internal Medicine

## 2024-04-12 ENCOUNTER — Telehealth (HOSPITAL_COMMUNITY): Payer: Self-pay

## 2024-04-12 MED ORDER — ENTRESTO 49-51 MG PO TABS: 1.0000 | ORAL_TABLET | Freq: Two times a day (BID) | ORAL | 0 refills | Status: DC

## 2024-04-12 NOTE — Telephone Encounter (Signed)
 Patient called requesting a refill for Entresto - has not been seen since 06/2021- advised patient she was overdue for follow up and this was needed to ensure future refills. Scheduled patient to see us  in clinic on 7/1 at 3pm and sent in 1 month supply of entresto  to get her through until follow up.   Advised patient to call back to office with any issues, questions, or concerns. Patient verbalized understanding.

## 2024-05-02 ENCOUNTER — Telehealth (HOSPITAL_COMMUNITY): Payer: Self-pay | Admitting: Cardiology

## 2024-05-02 NOTE — Telephone Encounter (Signed)
 Patient left VM on triage line reporting She will cancel 7/1 appt Reports bill for [facility] $1000 Requests referral to local cardiology  -appt with Dr Bernie pending

## 2024-05-07 ENCOUNTER — Encounter (HOSPITAL_COMMUNITY)

## 2024-05-21 ENCOUNTER — Encounter: Payer: Self-pay | Admitting: Cardiology

## 2024-05-21 ENCOUNTER — Ambulatory Visit: Attending: Cardiology | Admitting: Cardiology

## 2024-05-21 VITALS — BP 150/88 | HR 97 | Ht 68.0 in | Wt 212.6 lb

## 2024-05-21 DIAGNOSIS — R0609 Other forms of dyspnea: Secondary | ICD-10-CM

## 2024-05-21 DIAGNOSIS — Z8759 Personal history of other complications of pregnancy, childbirth and the puerperium: Secondary | ICD-10-CM

## 2024-05-21 DIAGNOSIS — R739 Hyperglycemia, unspecified: Secondary | ICD-10-CM

## 2024-05-21 DIAGNOSIS — I10 Essential (primary) hypertension: Secondary | ICD-10-CM

## 2024-05-21 DIAGNOSIS — O903 Peripartum cardiomyopathy: Secondary | ICD-10-CM

## 2024-05-21 DIAGNOSIS — E785 Hyperlipidemia, unspecified: Secondary | ICD-10-CM | POA: Insufficient documentation

## 2024-05-21 HISTORY — DX: Hyperlipidemia, unspecified: E78.5

## 2024-05-21 MED ORDER — FUROSEMIDE 20 MG PO TABS: 20.0000 mg | ORAL_TABLET | ORAL | 3 refills | Status: AC | PRN

## 2024-05-21 MED ORDER — POTASSIUM CHLORIDE CRYS ER 20 MEQ PO TBCR: 20.0000 meq | EXTENDED_RELEASE_TABLET | Freq: Every day | ORAL | 3 refills | Status: AC

## 2024-05-21 MED ORDER — CARVEDILOL 6.25 MG PO TABS: 6.2500 mg | ORAL_TABLET | Freq: Two times a day (BID) | ORAL | 3 refills | Status: AC

## 2024-05-21 MED ORDER — SACUBITRIL-VALSARTAN 49-51 MG PO TABS: 1.0000 | ORAL_TABLET | Freq: Two times a day (BID) | ORAL | 3 refills | Status: AC

## 2024-05-21 NOTE — Progress Notes (Signed)
 Cardiology Consultation:    Date:  05/21/2024   ID:  [name], DOB 01-11-1976, MRN [id number]  PCP:  [name], MD  Cardiologist:  [name], MD   Referring MD: No ref. provider found   Chief Complaint  Patient presents with   Establish Care   Medication Refill    [id number]     History of Present Illness:    [name] is a 48 y.o. female who is being seen today for the evaluation of heart problem at the request of No ref. provider found.  Past medical history significant for pericardium Cardiomyopathy issues pregnant with twins in 2008 December, ejection fraction 20% that was second pregnancy first pregnancy resulted in healthy child second pregnancy day 1 that led to cardiomyopathy she got 2 healthy twins after that she was put on guideline directed medical therapy ejection fraction normalized, additional issues essential hypertension dyslipidemia recently recognized prediabetes.  Comes today to my office 1 to be reestablished as a patient.  Overall doing well goes to gym on the regular basis she usually lifts weight but also does some aerobic exercise at home she had no difficulty doing it.  She has been doing this for years and recently started doing this will be more often.  Quit smoking when she became pregnant, is not on any special diet, does have family history of premature coronary disease her brother had myocardial infarction in her early age as well as her father.  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 LV function recovered EF 60% (6/09)    Essential hypertension 09/06/2021   HTN (hypertension)    Ovarian cyst rupture    Post-infarction apical thrombus (HCC)    resovled    Past Surgical History:  Procedure Laterality Date   CESAREAN SECTION     TONSILLECTOMY      Current Medications: Current Meds  Medication Sig   carvedilol  (COREG ) 6.25 MG tablet Take 1 tablet (6.25  mg total) by mouth 2 (two) times daily with a meal.   cholecalciferol (VITAMIN D) 1000 units tablet Take 1,000 Units by mouth daily.   furosemide  (LASIX ) 20 MG tablet TAKE 1 TABLET(20 MG) BY MOUTH DAILY AS NEEDED   Multiple Vitamin (MULTIVITAMIN WITH MINERALS) TABS tablet Take 1 tablet by mouth daily.   potassium chloride  SA (KLOR-CON  M20) 20 MEQ tablet Take 1 tablet (20 mEq total) by mouth daily.   sacubitril -valsartan  (ENTRESTO ) 49-51 MG Take 1 tablet by mouth 2 (two) times daily.   tirzepatide (MOUNJARO) 10 MG/0.5ML Pen Inject 10 mg into the skin once a week.     Allergies:   Patient has no known allergies.   Social History   Socioeconomic History   Marital status: Married    Spouse name: Not on file   Number of children: 3   Years of education: Not on file   Highest education level: Not on file  Occupational History   Not on file  Tobacco Use   Smoking status: Former    Current packs/day: 0.00    Average packs/day: 0.5 packs/day for 10.0 years (5.0 ttl pk-yrs)    Types: Cigarettes    Start date: 04/19/1996    Quit date: 04/19/2006    Years since quitting: 18.1   Smokeless tobacco: Never  Substance and Sexual Activity   Alcohol use: No   Drug use: No    Comment: Uses Vaporize  Sexual activity: Not on file  Other Topics Concern   Not on file  Social History Narrative   Does not smoke.    Social Drivers of Corporate investment banker Strain: Low Risk  (09/06/2021)   Overall Financial Resource Strain (CARDIA)    Difficulty of Paying Living Expenses: Not hard at all  Food Insecurity: No Food Insecurity (09/06/2021)   Hunger Vital Sign    Worried About Running Out of Food in the Last Year: Never true    Ran Out of Food in the Last Year: Never true  Transportation Needs: No Transportation Needs (09/06/2021)   PRAPARE - Administrator, Civil Service (Medical): No    Lack of Transportation (Non-Medical): No  Physical Activity: Sufficiently Active (09/06/2021)    Exercise Vital Sign    Days of Exercise per Week: 5 days    Minutes of Exercise per Session: 60 min  Stress: Not on file  Social Connections: Not on file     Family History: The patient's family history includes Coronary artery disease in her father and another family member; Heart attack in her brother; Heart failure in her father and paternal grandmother; Hypertension in her father and maternal grandmother; Thyroid  disease in her mother; Transient ischemic attack in her maternal grandmother. ROS:   Please see the history of present illness.    All 14 point review of systems negative except as described per history of present illness.  EKGs/Labs/Other Studies Reviewed:    The following studies were reviewed today:   EKG:       Recent Labs: No results found for requested labs within last 365 days.  Recent Lipid Panel    Component Value Date/Time   CHOL  10/13/2007 0055    140        ATP III CLASSIFICATION:  <200     mg/dL   Desirable  799-760  mg/dL   Borderline High  >=759    mg/dL   High   TRIG 795 (H) 87/93/7991 0055   HDL 20 (L) 10/13/2007 0055   CHOLHDL 7.0 10/13/2007 0055   VLDL 41 (H) 10/13/2007 0055   LDLCALC  10/13/2007 0055    79        Total Cholesterol/HDL:CHD Risk Coronary Heart Disease Risk Table                     Men   Women  1/2 Average Risk   3.4   3.3    Physical Exam:    VS:  BP (!) 150/88 (BP Location: Left Arm, Patient Position: Sitting)   Pulse 97   Ht 5' 8 (1.727 m)   Wt 212 lb 9.6 oz (96.4 kg)   SpO2 96%   BMI 32.33 kg/m     Wt Readings from Last 3 Encounters:  05/21/24 212 lb 9.6 oz (96.4 kg)  09/06/21 215 lb 6.4 oz (97.7 kg)  07/05/21 214 lb 9.6 oz (97.3 kg)     GEN:  Well nourished, well developed in no acute distress HEENT: Normal NECK: No JVD; No carotid bruits LYMPHATICS: No lymphadenopathy CARDIAC: RRR, no murmurs, no rubs, no gallops RESPIRATORY:  Clear to auscultation without rales, wheezing or rhonchi  ABDOMEN:  Soft, non-tender, non-distended MUSCULOSKELETAL:  No edema; No deformity  SKIN: Warm and dry NEUROLOGIC:  Alert and oriented x 3 PSYCHIATRIC:  Normal affect   ASSESSMENT:    1. Essential hypertension   2. Postpartum cardiomyopathy   3. Hyperglycemia  4. Dyslipidemia    PLAN:    In order of problems listed above:  History of cardiomyopathy postpartum on guideline directed medical therapy.  Will do refill on all her medications.  Echocardiogram to be done to recheck left ventricle ejection fraction. Essential hypertension, first visit my office blood pressure elevated we will recheck it before she gets out of the room on top of that she is telling me that she was trying to save her medication taking Entresto  and carvedilol  only once a day.  She will go back on twice daily regiment of this medication and the blood pressure will be checked at home for a week or 2 and she will bring results to me. Hyperlipidemia.  She is taking atorvastatin recently initiated by her primary care physician, will call office find the results of her fasting lipid profile. Borderline diabetes exercise on the regular basis is the key is to lose some weight.  Will check her hemoglobin A1c from primary care physician.   Medication Adjustments/Labs and Tests Ordered: Current medicines are reviewed at length with the patient today.  Concerns regarding medicines are outlined above.  Orders Placed This Encounter  Procedures   EKG 12-Lead   No orders of the defined types were placed in this encounter.   Signed, Lamar DOROTHA Fitch, MD, [facility]. 05/21/2024 9:20 AM    [facility]

## 2024-05-21 NOTE — Addendum Note (Signed)
 Addended by: ARLOA PLANAS D on: 05/21/2024 09:27 AM   Modules accepted: Orders

## 2024-05-21 NOTE — Patient Instructions (Addendum)

## 2024-05-22 ENCOUNTER — Telehealth: Payer: Self-pay

## 2024-05-22 NOTE — Telephone Encounter (Signed)
 Patient prescription for furosemide  clarified  with the pharmacy.

## 2024-05-23 ENCOUNTER — Ambulatory Visit: Attending: Cardiology

## 2024-05-23 DIAGNOSIS — R0609 Other forms of dyspnea: Secondary | ICD-10-CM | POA: Diagnosis not present

## 2024-05-23 LAB — ECHOCARDIOGRAM COMPLETE
AR max vel: 1.39 cm2
AV Area VTI: 1.75 cm2
AV Area mean vel: 1.34 cm2
AV Mean grad: 4 mmHg
AV Peak grad: 7 mmHg
Ao pk vel: 1.32 m/s
Area-P 1/2: 2.76 cm2
MV VTI: 0.99 cm2
S' Lateral: 2.7 cm

## 2024-05-24 ENCOUNTER — Ambulatory Visit: Payer: Self-pay | Admitting: Cardiology

## 2024-11-19 DIAGNOSIS — I236 Thrombosis of atrium, auricular appendage, and ventricle as current complications following acute myocardial infarction: Secondary | ICD-10-CM | POA: Insufficient documentation

## 2024-11-19 DIAGNOSIS — I10 Essential (primary) hypertension: Secondary | ICD-10-CM | POA: Insufficient documentation

## 2024-11-19 DIAGNOSIS — I509 Heart failure, unspecified: Secondary | ICD-10-CM | POA: Insufficient documentation

## 2024-11-19 DIAGNOSIS — N83209 Unspecified ovarian cyst, unspecified side: Secondary | ICD-10-CM | POA: Insufficient documentation

## 2024-11-20 ENCOUNTER — Encounter: Payer: Self-pay | Admitting: Cardiology

## 2024-11-20 ENCOUNTER — Ambulatory Visit: Attending: Cardiology | Admitting: Cardiology

## 2024-11-20 VITALS — BP 117/80 | HR 72 | Ht 68.0 in | Wt 181.0 lb

## 2024-11-20 DIAGNOSIS — I10 Essential (primary) hypertension: Secondary | ICD-10-CM | POA: Diagnosis not present

## 2024-11-20 DIAGNOSIS — E785 Hyperlipidemia, unspecified: Secondary | ICD-10-CM | POA: Diagnosis not present

## 2024-11-20 DIAGNOSIS — Z8759 Personal history of other complications of pregnancy, childbirth and the puerperium: Secondary | ICD-10-CM | POA: Diagnosis not present

## 2024-11-20 DIAGNOSIS — O903 Peripartum cardiomyopathy: Secondary | ICD-10-CM

## 2024-11-20 DIAGNOSIS — R0609 Other forms of dyspnea: Secondary | ICD-10-CM

## 2024-11-20 NOTE — Addendum Note (Signed)
 Addended by: ARLOA MALLORY D on: 11/20/2024 08:49 AM   Modules accepted: Orders

## 2024-11-20 NOTE — Progress Notes (Signed)
 " Cardiology Office Note:    Date:  11/20/2024   ID:  [name], DOB 06/21/1976, MRN [id number]  PCP:  [name], MD  Cardiologist:  [name], MD    Referring MD: [name], MD   Chief Complaint  Patient presents with   Follow-up  Doing very well  History of Present Illness:    [name] is a 49 y.o. female past medical history significant for peripartum cardiomyopathy that she suffered from during her pregnancy with twins in 2008 ejection fraction 20% that was her second pregnancy she was put on guideline directed medical therapy and normalized ejection fraction.  Additional problem include essential hypertension, dyslipidemia, recently recognized prediabetes.  Comes today to my office for follow-up, overall doing very well.  She goes to gym on the regular basis exercise with no difficulties.  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 LV function recovered EF 60% (6/09)    Dyslipidemia 05/21/2024   Essential hypertension 09/06/2021   HTN (hypertension)    Hyperglycemia 02/13/2016   Hypokalemia 02/13/2016   Hypothermia    Ovarian cyst rupture    Post-infarction apical thrombus (HCC)    resovled   Postpartum cardiomyopathy 02/13/2016   Syncope 02/13/2016    Past Surgical History:  Procedure Laterality Date   CESAREAN SECTION     TONSILLECTOMY      Current Medications: Active Medications[1]   Allergies:   Patient has no known allergies.   Social History   Socioeconomic History   Marital status: Married    Spouse name: Not on file   Number of children: 3   Years of education: Not on file   Highest education level: Not on file  Occupational History   Not on file  Tobacco Use   Smoking status: Former    Current packs/day: 0.00    Average packs/day: 0.5 packs/day for 10.0 years (5.0 ttl  pk-yrs)    Types: Cigarettes    Start date: 04/19/1996    Quit date: 04/19/2006    Years since quitting: 18.6   Smokeless tobacco: Never  Substance and Sexual Activity   Alcohol use: No   Drug use: No    Comment: Uses Vaporize   Sexual activity: Not on file  Other Topics Concern   Not on file  Social History Narrative   Does not smoke.    Social Drivers of Health   Tobacco Use: Medium Risk (11/20/2024)   Patient History    Smoking Tobacco Use: Former    Smokeless Tobacco Use: Never    Passive Exposure: Not on file  Financial Resource Strain: Not on file  Food Insecurity: Low Risk (11/04/2024)   Received from [facility]   Epic    Within the past 12 months, you worried that your food would run out before you got money to buy more: Never true    Within the past 12 months, the food you bought just didn't last and you didn't have money to get more. : Never true  Transportation Needs: No Transportation Needs (11/04/2024)   Received from [facility]    In the past 12 months, has lack of reliable transportation kept you from medical appointments, meetings, work or from getting things needed for daily living? : No  Physical Activity: Not on file  Stress: Not on  file  Social Connections: Not on file  Depression (EYV7-0): Not on file  Alcohol Screen: Not on file  Housing: Low Risk (11/04/2024)   Received from [facility]   Epic    What is your living situation today?: I have a steady place to live    Think about the place you live. Do you have problems with any of the following? Choose all that apply:: None/None on this list  Utilities: Low Risk (11/04/2024)   Received from [facility]   Utilities    In the past 12 months has the electric, gas, oil, or water company threatened to shut off services in your home? : No  Health Literacy: Not on file     Family History: The patient's family history includes Coronary artery disease in her father and another  family member; Heart attack in her brother; Heart failure in her father and paternal grandmother; Hypertension in her father and maternal grandmother; Thyroid  disease in her mother; Transient ischemic attack in her maternal grandmother. ROS:   Please see the history of present illness.    All 14 point review of systems negative except as described per history of present illness  EKGs/Labs/Other Studies Reviewed:         Recent Labs: No results found for requested labs within last 365 days.  Recent Lipid Panel    Component Value Date/Time   CHOL  10/13/2007 0055    140        ATP III CLASSIFICATION:  <200     mg/dL   Desirable  799-760  mg/dL   Borderline High  >=759    mg/dL   High   TRIG 795 (H) 87/93/7991 0055   HDL 20 (L) 10/13/2007 0055   CHOLHDL 7.0 10/13/2007 0055   VLDL 41 (H) 10/13/2007 0055   LDLCALC  10/13/2007 0055    79        Total Cholesterol/HDL:CHD Risk Coronary Heart Disease Risk Table                     Men   Women  1/2 Average Risk   3.4   3.3    Physical Exam:    VS:  BP 117/80   Pulse 72   Ht 5' 8 (1.727 m)   Wt 181 lb (82.1 kg)   SpO2 99%   BMI 27.52 kg/m     Wt Readings from Last 3 Encounters:  11/20/24 181 lb (82.1 kg)  05/21/24 212 lb 9.6 oz (96.4 kg)  09/06/21 215 lb 6.4 oz (97.7 kg)     GEN:  Well nourished, well developed in no acute distress HEENT: Normal NECK: No JVD; No carotid bruits LYMPHATICS: No lymphadenopathy CARDIAC: RRR, no murmurs, no rubs, no gallops RESPIRATORY:  Clear to auscultation without rales, wheezing or rhonchi  ABDOMEN: Soft, non-tender, non-distended MUSCULOSKELETAL:  No edema; No deformity  SKIN: Warm and dry LOWER EXTREMITIES: no swelling NEUROLOGIC:  Alert and oriented x 3 PSYCHIATRIC:  Normal affect   ASSESSMENT:    1. Postpartum cardiomyopathy   2. Essential hypertension   3. Dyslipidemia    PLAN:    In order of problems listed above:  History of postpartum cardiomyopathy, that was in  2008, her ejection fraction last check is normal.  Continue monitoring.  Continue guideline directed medical therapy.  We had a long discussion about the situation.  We discussed the fact that it is safe for her not to remove her medications for cardiomyopathy likely  she does have some additional reason to be on it, therefore, we will continue with Entresto  and Coreg .  Good tolerance doing very well. Essential hypertension blood pressure well-controlled. Dyslipidemia she said she had cholesterol checked by her primary care physician recently, I do not have any information about it, will try to call them and get copy of her fasting lipid profile. Diabetes mellitus followed by primary care physician will call for hemoglobin A1c   Medication Adjustments/Labs and Tests Ordered: Current medicines are reviewed at length with the patient today.  Concerns regarding medicines are outlined above.  No orders of the defined types were placed in this encounter.  Medication changes: No orders of the defined types were placed in this encounter.   Signed, Lamar DOROTHA Fitch, MD, [facility] 11/20/2024 8:43 AM    [facility]    [1]  Current Meds  Medication Sig   atorvastatin (LIPITOR) 40 MG tablet Take 40 mg by mouth at bedtime.   carvedilol  (COREG ) 6.25 MG tablet Take 1 tablet (6.25 mg total) by mouth 2 (two) times daily with a meal.   cholecalciferol (VITAMIN D) 1000 units tablet Take 1,000 Units by mouth daily.   furosemide  (LASIX ) 20 MG tablet Take 1 tablet (20 mg total) by mouth as needed.   Multiple Vitamin (MULTIVITAMIN WITH MINERALS) TABS tablet Take 1 tablet by mouth daily.   potassium chloride  SA (KLOR-CON  M20) 20 MEQ tablet Take 1 tablet (20 mEq total) by mouth daily.   sacubitril -valsartan  (ENTRESTO ) 49-51 MG Take 1 tablet by mouth 2 (two) times daily.   tirzepatide (MOUNJARO) 10 MG/0.5ML Pen Inject 10 mg into the skin once a week.   "

## 2024-11-20 NOTE — Patient Instructions (Addendum)
 Medication Instructions:  Your physician recommends that you continue on your current medications as directed. Please refer to the Current Medication list given to you today.  *If you need a refill on your cardiac medications before your next appointment, please call your pharmacy*   Lab Work: None Ordered If you have labs (blood work) drawn today and your tests are completely normal, you will receive your results only by: MyChart Message (if you have MyChart) OR A paper copy in the mail If you have any lab test that is abnormal or we need to change your treatment, we will call you to review the results.   Testing/Procedures: 1 year prior to Appt Your physician has requested that you have an echocardiogram. Echocardiography is a painless test that uses sound waves to create images of your heart. It provides your doctor with information about the size and shape of your heart and how well your hearts chambers and valves are working. This procedure takes approximately one hour. There are no restrictions for this procedure. Please do NOT wear cologne, perfume, aftershave, or lotions (deodorant is allowed). Please arrive 15 minutes prior to your appointment time.  Please note: We ask at that you not bring children with you during ultrasound (echo/ vascular) testing. Due to room size and safety concerns, children are not allowed in the ultrasound rooms during exams. Our front office staff cannot provide observation of children in our lobby area while testing is being conducted. An adult accompanying a patient to their appointment will only be allowed in the ultrasound room at the discretion of the ultrasound technician under special circumstances. We apologize for any inconvenience.    Follow-Up: At [facility], you and your health needs are our priority.  As part of our continuing mission to provide you with exceptional heart care, we have created designated Provider Care Teams.  These Care  Teams include your primary Cardiologist (physician) and Advanced Practice Providers (APPs -  Physician Assistants and Nurse Practitioners) who all work together to provide you with the care you need, when you need it.  We recommend signing up for the patient portal called MyChart.  Sign up information is provided on this After Visit Summary.  MyChart is used to connect with patients for Virtual Visits (Telemedicine).  Patients are able to view lab/test results, encounter notes, upcoming appointments, etc.  Non-urgent messages can be sent to your provider as well.   To learn more about what you can do with MyChart, go to [email or web address].    Your next appointment:   12 month(s)  The format for your next appointment:   In Person  Provider:   [name], MD    Other Instructions NA
